# Patient Record
Sex: Female | Born: 1935 | Race: White | Hispanic: No | Smoking: Never smoker
Health system: Southern US, Community
[De-identification: ages and names within clinical notes are randomized; demographics above are authoritative.]

## PROBLEM LIST (undated history)

## (undated) HISTORY — PX: PARTIAL HIP ARTHROPLASTY: SHX733

---

## 2011-09-05 DIAGNOSIS — E785 Hyperlipidemia, unspecified: Secondary | ICD-10-CM | POA: Diagnosis present

## 2012-09-26 DIAGNOSIS — J309 Allergic rhinitis, unspecified: Secondary | ICD-10-CM | POA: Insufficient documentation

## 2012-10-24 DIAGNOSIS — M810 Age-related osteoporosis without current pathological fracture: Secondary | ICD-10-CM | POA: Insufficient documentation

## 2012-10-24 DIAGNOSIS — A6000 Herpesviral infection of urogenital system, unspecified: Secondary | ICD-10-CM | POA: Insufficient documentation

## 2012-10-24 DIAGNOSIS — D134 Benign neoplasm of liver: Secondary | ICD-10-CM | POA: Insufficient documentation

## 2013-03-31 DIAGNOSIS — Z85828 Personal history of other malignant neoplasm of skin: Secondary | ICD-10-CM | POA: Insufficient documentation

## 2014-11-17 DIAGNOSIS — I10 Essential (primary) hypertension: Secondary | ICD-10-CM | POA: Diagnosis present

## 2015-01-07 DIAGNOSIS — Z8639 Personal history of other endocrine, nutritional and metabolic disease: Secondary | ICD-10-CM | POA: Insufficient documentation

## 2015-02-04 DIAGNOSIS — E042 Nontoxic multinodular goiter: Secondary | ICD-10-CM | POA: Insufficient documentation

## 2015-02-04 DIAGNOSIS — E890 Postprocedural hypothyroidism: Secondary | ICD-10-CM | POA: Diagnosis present

## 2017-11-20 DIAGNOSIS — M810 Age-related osteoporosis without current pathological fracture: Secondary | ICD-10-CM | POA: Diagnosis not present

## 2017-11-20 DIAGNOSIS — E782 Mixed hyperlipidemia: Secondary | ICD-10-CM | POA: Diagnosis not present

## 2017-11-20 DIAGNOSIS — Z0000 Encounter for general adult medical examination without abnormal findings: Secondary | ICD-10-CM | POA: Diagnosis not present

## 2017-11-20 DIAGNOSIS — E890 Postprocedural hypothyroidism: Secondary | ICD-10-CM | POA: Diagnosis not present

## 2017-11-20 DIAGNOSIS — I10 Essential (primary) hypertension: Secondary | ICD-10-CM | POA: Diagnosis not present

## 2017-12-13 DIAGNOSIS — H61001 Unspecified perichondritis of right external ear: Secondary | ICD-10-CM | POA: Diagnosis not present

## 2018-04-25 DIAGNOSIS — L821 Other seborrheic keratosis: Secondary | ICD-10-CM | POA: Diagnosis not present

## 2018-04-25 DIAGNOSIS — D2270 Melanocytic nevi of unspecified lower limb, including hip: Secondary | ICD-10-CM | POA: Diagnosis not present

## 2018-04-25 DIAGNOSIS — L820 Inflamed seborrheic keratosis: Secondary | ICD-10-CM | POA: Diagnosis not present

## 2018-04-25 DIAGNOSIS — L814 Other melanin hyperpigmentation: Secondary | ICD-10-CM | POA: Diagnosis not present

## 2018-05-10 DIAGNOSIS — H8110 Benign paroxysmal vertigo, unspecified ear: Secondary | ICD-10-CM | POA: Diagnosis not present

## 2018-05-20 DIAGNOSIS — E890 Postprocedural hypothyroidism: Secondary | ICD-10-CM | POA: Diagnosis not present

## 2018-05-21 DIAGNOSIS — R42 Dizziness and giddiness: Secondary | ICD-10-CM | POA: Diagnosis not present

## 2018-05-21 DIAGNOSIS — I959 Hypotension, unspecified: Secondary | ICD-10-CM | POA: Diagnosis not present

## 2018-05-26 DIAGNOSIS — E782 Mixed hyperlipidemia: Secondary | ICD-10-CM | POA: Diagnosis not present

## 2018-05-26 DIAGNOSIS — M810 Age-related osteoporosis without current pathological fracture: Secondary | ICD-10-CM | POA: Diagnosis not present

## 2018-05-26 DIAGNOSIS — E890 Postprocedural hypothyroidism: Secondary | ICD-10-CM | POA: Diagnosis not present

## 2018-05-26 DIAGNOSIS — I10 Essential (primary) hypertension: Secondary | ICD-10-CM | POA: Diagnosis not present

## 2018-05-28 DIAGNOSIS — Z7409 Other reduced mobility: Secondary | ICD-10-CM | POA: Diagnosis not present

## 2018-05-28 DIAGNOSIS — R51 Headache: Secondary | ICD-10-CM | POA: Diagnosis not present

## 2018-05-28 DIAGNOSIS — R42 Dizziness and giddiness: Secondary | ICD-10-CM | POA: Diagnosis not present

## 2018-06-05 DIAGNOSIS — I639 Cerebral infarction, unspecified: Secondary | ICD-10-CM | POA: Insufficient documentation

## 2018-06-05 DIAGNOSIS — R930 Abnormal findings on diagnostic imaging of skull and head, not elsewhere classified: Secondary | ICD-10-CM | POA: Diagnosis not present

## 2018-06-05 DIAGNOSIS — R42 Dizziness and giddiness: Secondary | ICD-10-CM | POA: Insufficient documentation

## 2018-06-05 DIAGNOSIS — I6339 Cerebral infarction due to thrombosis of other cerebral artery: Secondary | ICD-10-CM | POA: Diagnosis not present

## 2018-06-05 DIAGNOSIS — R51 Headache: Secondary | ICD-10-CM | POA: Diagnosis not present

## 2018-06-05 DIAGNOSIS — R2681 Unsteadiness on feet: Secondary | ICD-10-CM | POA: Diagnosis not present

## 2018-06-09 DIAGNOSIS — R5382 Chronic fatigue, unspecified: Secondary | ICD-10-CM | POA: Diagnosis not present

## 2018-06-16 DIAGNOSIS — R928 Other abnormal and inconclusive findings on diagnostic imaging of breast: Secondary | ICD-10-CM | POA: Diagnosis not present

## 2018-06-16 DIAGNOSIS — R930 Abnormal findings on diagnostic imaging of skull and head, not elsewhere classified: Secondary | ICD-10-CM | POA: Diagnosis not present

## 2018-06-24 DIAGNOSIS — R2681 Unsteadiness on feet: Secondary | ICD-10-CM | POA: Diagnosis not present

## 2018-06-24 DIAGNOSIS — R42 Dizziness and giddiness: Secondary | ICD-10-CM | POA: Diagnosis not present

## 2018-06-24 DIAGNOSIS — G039 Meningitis, unspecified: Secondary | ICD-10-CM | POA: Diagnosis not present

## 2018-06-24 DIAGNOSIS — I951 Orthostatic hypotension: Secondary | ICD-10-CM | POA: Diagnosis not present

## 2018-06-24 DIAGNOSIS — R51 Headache: Secondary | ICD-10-CM | POA: Diagnosis not present

## 2018-06-24 DIAGNOSIS — R0609 Other forms of dyspnea: Secondary | ICD-10-CM | POA: Diagnosis not present

## 2018-06-24 DIAGNOSIS — D352 Benign neoplasm of pituitary gland: Secondary | ICD-10-CM | POA: Diagnosis not present

## 2018-06-27 DIAGNOSIS — E237 Disorder of pituitary gland, unspecified: Secondary | ICD-10-CM | POA: Diagnosis not present

## 2018-06-27 DIAGNOSIS — E890 Postprocedural hypothyroidism: Secondary | ICD-10-CM | POA: Diagnosis not present

## 2018-06-27 DIAGNOSIS — R42 Dizziness and giddiness: Secondary | ICD-10-CM | POA: Diagnosis not present

## 2018-06-27 DIAGNOSIS — E063 Autoimmune thyroiditis: Secondary | ICD-10-CM | POA: Diagnosis not present

## 2018-06-30 DIAGNOSIS — I10 Essential (primary) hypertension: Secondary | ICD-10-CM | POA: Diagnosis not present

## 2018-07-08 DIAGNOSIS — G9389 Other specified disorders of brain: Secondary | ICD-10-CM | POA: Diagnosis not present

## 2018-07-08 DIAGNOSIS — G038 Meningitis due to other specified causes: Secondary | ICD-10-CM | POA: Diagnosis not present

## 2018-07-08 DIAGNOSIS — G039 Meningitis, unspecified: Secondary | ICD-10-CM | POA: Diagnosis not present

## 2018-07-09 DIAGNOSIS — G9389 Other specified disorders of brain: Secondary | ICD-10-CM | POA: Diagnosis not present

## 2018-07-09 DIAGNOSIS — G039 Meningitis, unspecified: Secondary | ICD-10-CM | POA: Diagnosis not present

## 2018-07-09 DIAGNOSIS — G038 Meningitis due to other specified causes: Secondary | ICD-10-CM | POA: Diagnosis not present

## 2018-07-11 DIAGNOSIS — I10 Essential (primary) hypertension: Secondary | ICD-10-CM | POA: Diagnosis not present

## 2018-07-11 DIAGNOSIS — R0602 Shortness of breath: Secondary | ICD-10-CM | POA: Diagnosis not present

## 2018-07-15 DIAGNOSIS — H2513 Age-related nuclear cataract, bilateral: Secondary | ICD-10-CM | POA: Diagnosis not present

## 2018-07-15 DIAGNOSIS — E237 Disorder of pituitary gland, unspecified: Secondary | ICD-10-CM | POA: Diagnosis not present

## 2018-09-05 DIAGNOSIS — Z23 Encounter for immunization: Secondary | ICD-10-CM | POA: Diagnosis not present

## 2018-09-30 DIAGNOSIS — R899 Unspecified abnormal finding in specimens from other organs, systems and tissues: Secondary | ICD-10-CM | POA: Diagnosis not present

## 2018-09-30 DIAGNOSIS — R2681 Unsteadiness on feet: Secondary | ICD-10-CM | POA: Diagnosis not present

## 2018-09-30 DIAGNOSIS — R5383 Other fatigue: Secondary | ICD-10-CM | POA: Diagnosis not present

## 2018-09-30 DIAGNOSIS — G9389 Other specified disorders of brain: Secondary | ICD-10-CM | POA: Diagnosis not present

## 2018-10-02 DIAGNOSIS — R768 Other specified abnormal immunological findings in serum: Secondary | ICD-10-CM | POA: Diagnosis not present

## 2018-10-02 DIAGNOSIS — M150 Primary generalized (osteo)arthritis: Secondary | ICD-10-CM | POA: Diagnosis not present

## 2018-10-02 DIAGNOSIS — G9389 Other specified disorders of brain: Secondary | ICD-10-CM | POA: Diagnosis not present

## 2018-10-03 DIAGNOSIS — R0602 Shortness of breath: Secondary | ICD-10-CM | POA: Diagnosis not present

## 2018-10-03 DIAGNOSIS — I10 Essential (primary) hypertension: Secondary | ICD-10-CM | POA: Diagnosis not present

## 2018-10-08 DIAGNOSIS — G96810 Intracranial hypotension, unspecified: Secondary | ICD-10-CM | POA: Insufficient documentation

## 2018-11-17 DIAGNOSIS — G960 Cerebrospinal fluid leak: Secondary | ICD-10-CM | POA: Diagnosis not present

## 2018-11-27 DIAGNOSIS — Z01818 Encounter for other preprocedural examination: Secondary | ICD-10-CM | POA: Diagnosis not present

## 2018-12-02 DIAGNOSIS — G9389 Other specified disorders of brain: Secondary | ICD-10-CM | POA: Diagnosis not present

## 2018-12-02 DIAGNOSIS — M2518 Fistula, other specified site: Secondary | ICD-10-CM | POA: Diagnosis not present

## 2019-01-12 DIAGNOSIS — G9389 Other specified disorders of brain: Secondary | ICD-10-CM | POA: Diagnosis not present

## 2019-01-12 DIAGNOSIS — M549 Dorsalgia, unspecified: Secondary | ICD-10-CM | POA: Diagnosis not present

## 2019-01-16 DIAGNOSIS — L821 Other seborrheic keratosis: Secondary | ICD-10-CM | POA: Diagnosis not present

## 2019-01-16 DIAGNOSIS — I781 Nevus, non-neoplastic: Secondary | ICD-10-CM | POA: Diagnosis not present

## 2019-01-16 DIAGNOSIS — Z85828 Personal history of other malignant neoplasm of skin: Secondary | ICD-10-CM | POA: Diagnosis not present

## 2019-01-16 DIAGNOSIS — L814 Other melanin hyperpigmentation: Secondary | ICD-10-CM | POA: Diagnosis not present

## 2019-01-20 DIAGNOSIS — M6281 Muscle weakness (generalized): Secondary | ICD-10-CM | POA: Diagnosis not present

## 2019-01-20 DIAGNOSIS — R262 Difficulty in walking, not elsewhere classified: Secondary | ICD-10-CM | POA: Diagnosis not present

## 2019-01-20 DIAGNOSIS — M546 Pain in thoracic spine: Secondary | ICD-10-CM | POA: Diagnosis not present

## 2019-03-24 DIAGNOSIS — E236 Other disorders of pituitary gland: Secondary | ICD-10-CM | POA: Diagnosis not present

## 2019-03-25 DIAGNOSIS — J383 Other diseases of vocal cords: Secondary | ICD-10-CM | POA: Diagnosis not present

## 2019-03-25 DIAGNOSIS — J385 Laryngeal spasm: Secondary | ICD-10-CM | POA: Diagnosis not present

## 2019-03-25 DIAGNOSIS — R490 Dysphonia: Secondary | ICD-10-CM | POA: Diagnosis not present

## 2019-03-26 DIAGNOSIS — R490 Dysphonia: Secondary | ICD-10-CM | POA: Insufficient documentation

## 2019-04-07 DIAGNOSIS — E890 Postprocedural hypothyroidism: Secondary | ICD-10-CM | POA: Diagnosis not present

## 2019-04-16 DIAGNOSIS — E890 Postprocedural hypothyroidism: Secondary | ICD-10-CM | POA: Diagnosis not present

## 2019-04-16 DIAGNOSIS — E063 Autoimmune thyroiditis: Secondary | ICD-10-CM | POA: Diagnosis not present

## 2019-04-16 DIAGNOSIS — I10 Essential (primary) hypertension: Secondary | ICD-10-CM | POA: Diagnosis not present

## 2019-04-16 DIAGNOSIS — E237 Disorder of pituitary gland, unspecified: Secondary | ICD-10-CM | POA: Diagnosis not present

## 2019-04-22 DIAGNOSIS — J385 Laryngeal spasm: Secondary | ICD-10-CM | POA: Diagnosis not present

## 2019-04-22 DIAGNOSIS — R490 Dysphonia: Secondary | ICD-10-CM | POA: Diagnosis not present

## 2019-04-22 DIAGNOSIS — J383 Other diseases of vocal cords: Secondary | ICD-10-CM | POA: Diagnosis not present

## 2019-04-30 DIAGNOSIS — R490 Dysphonia: Secondary | ICD-10-CM | POA: Diagnosis not present

## 2019-04-30 DIAGNOSIS — J385 Laryngeal spasm: Secondary | ICD-10-CM | POA: Diagnosis not present

## 2019-04-30 DIAGNOSIS — J383 Other diseases of vocal cords: Secondary | ICD-10-CM | POA: Diagnosis not present

## 2019-06-03 DIAGNOSIS — E236 Other disorders of pituitary gland: Secondary | ICD-10-CM | POA: Diagnosis not present

## 2019-06-19 DIAGNOSIS — L814 Other melanin hyperpigmentation: Secondary | ICD-10-CM | POA: Diagnosis not present

## 2019-06-19 DIAGNOSIS — B353 Tinea pedis: Secondary | ICD-10-CM | POA: Diagnosis not present

## 2019-06-19 DIAGNOSIS — L821 Other seborrheic keratosis: Secondary | ICD-10-CM | POA: Diagnosis not present

## 2019-06-19 DIAGNOSIS — L820 Inflamed seborrheic keratosis: Secondary | ICD-10-CM | POA: Diagnosis not present

## 2019-06-30 DIAGNOSIS — Z79899 Other long term (current) drug therapy: Secondary | ICD-10-CM | POA: Diagnosis not present

## 2019-06-30 DIAGNOSIS — R490 Dysphonia: Secondary | ICD-10-CM | POA: Diagnosis not present

## 2019-06-30 DIAGNOSIS — J383 Other diseases of vocal cords: Secondary | ICD-10-CM | POA: Diagnosis not present

## 2019-07-27 DIAGNOSIS — N9089 Other specified noninflammatory disorders of vulva and perineum: Secondary | ICD-10-CM | POA: Diagnosis not present

## 2019-07-27 DIAGNOSIS — Z1231 Encounter for screening mammogram for malignant neoplasm of breast: Secondary | ICD-10-CM | POA: Diagnosis not present

## 2019-08-06 DIAGNOSIS — Z1231 Encounter for screening mammogram for malignant neoplasm of breast: Secondary | ICD-10-CM | POA: Diagnosis not present

## 2019-08-19 DIAGNOSIS — I10 Essential (primary) hypertension: Secondary | ICD-10-CM | POA: Diagnosis not present

## 2019-08-23 DIAGNOSIS — I10 Essential (primary) hypertension: Secondary | ICD-10-CM | POA: Diagnosis not present

## 2019-08-23 DIAGNOSIS — R519 Headache, unspecified: Secondary | ICD-10-CM | POA: Diagnosis not present

## 2019-08-23 DIAGNOSIS — R6883 Chills (without fever): Secondary | ICD-10-CM | POA: Diagnosis not present

## 2019-08-23 DIAGNOSIS — R410 Disorientation, unspecified: Secondary | ICD-10-CM | POA: Diagnosis not present

## 2019-08-23 DIAGNOSIS — R42 Dizziness and giddiness: Secondary | ICD-10-CM | POA: Diagnosis not present

## 2019-08-23 DIAGNOSIS — Z79899 Other long term (current) drug therapy: Secondary | ICD-10-CM | POA: Diagnosis not present

## 2019-08-23 DIAGNOSIS — R531 Weakness: Secondary | ICD-10-CM | POA: Diagnosis not present

## 2019-08-23 DIAGNOSIS — R4182 Altered mental status, unspecified: Secondary | ICD-10-CM | POA: Diagnosis not present

## 2019-08-28 DIAGNOSIS — R0602 Shortness of breath: Secondary | ICD-10-CM | POA: Diagnosis not present

## 2019-08-28 DIAGNOSIS — I10 Essential (primary) hypertension: Secondary | ICD-10-CM | POA: Diagnosis not present

## 2019-10-06 DIAGNOSIS — I10 Essential (primary) hypertension: Secondary | ICD-10-CM | POA: Diagnosis not present

## 2019-10-30 DIAGNOSIS — R0602 Shortness of breath: Secondary | ICD-10-CM | POA: Diagnosis not present

## 2019-10-30 DIAGNOSIS — I10 Essential (primary) hypertension: Secondary | ICD-10-CM | POA: Diagnosis not present

## 2019-11-17 DIAGNOSIS — H02125 Mechanical ectropion of left lower eyelid: Secondary | ICD-10-CM | POA: Diagnosis not present

## 2019-12-08 DIAGNOSIS — H04123 Dry eye syndrome of bilateral lacrimal glands: Secondary | ICD-10-CM | POA: Diagnosis not present

## 2020-01-06 DIAGNOSIS — M810 Age-related osteoporosis without current pathological fracture: Secondary | ICD-10-CM | POA: Diagnosis not present

## 2020-01-06 DIAGNOSIS — K219 Gastro-esophageal reflux disease without esophagitis: Secondary | ICD-10-CM | POA: Diagnosis not present

## 2020-01-06 DIAGNOSIS — E039 Hypothyroidism, unspecified: Secondary | ICD-10-CM | POA: Diagnosis not present

## 2020-01-06 DIAGNOSIS — I10 Essential (primary) hypertension: Secondary | ICD-10-CM | POA: Diagnosis not present

## 2020-01-19 DIAGNOSIS — C44529 Squamous cell carcinoma of skin of other part of trunk: Secondary | ICD-10-CM | POA: Diagnosis not present

## 2020-01-19 DIAGNOSIS — L812 Freckles: Secondary | ICD-10-CM | POA: Diagnosis not present

## 2020-01-19 DIAGNOSIS — D485 Neoplasm of uncertain behavior of skin: Secondary | ICD-10-CM | POA: Diagnosis not present

## 2020-01-19 DIAGNOSIS — L821 Other seborrheic keratosis: Secondary | ICD-10-CM | POA: Diagnosis not present

## 2020-01-19 DIAGNOSIS — H61001 Unspecified perichondritis of right external ear: Secondary | ICD-10-CM | POA: Diagnosis not present

## 2020-02-02 DIAGNOSIS — E890 Postprocedural hypothyroidism: Secondary | ICD-10-CM | POA: Diagnosis not present

## 2020-02-02 DIAGNOSIS — R634 Abnormal weight loss: Secondary | ICD-10-CM | POA: Diagnosis not present

## 2020-02-02 DIAGNOSIS — E237 Disorder of pituitary gland, unspecified: Secondary | ICD-10-CM | POA: Diagnosis not present

## 2020-02-02 DIAGNOSIS — E063 Autoimmune thyroiditis: Secondary | ICD-10-CM | POA: Diagnosis not present

## 2020-02-03 DIAGNOSIS — L820 Inflamed seborrheic keratosis: Secondary | ICD-10-CM | POA: Diagnosis not present

## 2020-02-03 DIAGNOSIS — L821 Other seborrheic keratosis: Secondary | ICD-10-CM | POA: Diagnosis not present

## 2020-02-03 DIAGNOSIS — D485 Neoplasm of uncertain behavior of skin: Secondary | ICD-10-CM | POA: Diagnosis not present

## 2020-03-08 DIAGNOSIS — C44529 Squamous cell carcinoma of skin of other part of trunk: Secondary | ICD-10-CM | POA: Diagnosis not present

## 2020-03-08 DIAGNOSIS — C44229 Squamous cell carcinoma of skin of left ear and external auricular canal: Secondary | ICD-10-CM | POA: Diagnosis not present

## 2020-03-08 DIAGNOSIS — L820 Inflamed seborrheic keratosis: Secondary | ICD-10-CM | POA: Diagnosis not present

## 2020-03-08 DIAGNOSIS — C44622 Squamous cell carcinoma of skin of right upper limb, including shoulder: Secondary | ICD-10-CM | POA: Diagnosis not present

## 2020-03-08 DIAGNOSIS — L245 Irritant contact dermatitis due to other chemical products: Secondary | ICD-10-CM | POA: Diagnosis not present

## 2020-03-08 DIAGNOSIS — D485 Neoplasm of uncertain behavior of skin: Secondary | ICD-10-CM | POA: Diagnosis not present

## 2020-03-21 DIAGNOSIS — M25552 Pain in left hip: Secondary | ICD-10-CM | POA: Diagnosis not present

## 2020-03-21 DIAGNOSIS — M7072 Other bursitis of hip, left hip: Secondary | ICD-10-CM | POA: Diagnosis not present

## 2020-03-24 DIAGNOSIS — M25552 Pain in left hip: Secondary | ICD-10-CM | POA: Diagnosis not present

## 2020-03-25 DIAGNOSIS — M25552 Pain in left hip: Secondary | ICD-10-CM | POA: Insufficient documentation

## 2020-04-04 DIAGNOSIS — M25552 Pain in left hip: Secondary | ICD-10-CM | POA: Diagnosis not present

## 2020-04-12 DIAGNOSIS — M25552 Pain in left hip: Secondary | ICD-10-CM | POA: Diagnosis not present

## 2020-05-11 DIAGNOSIS — Z0000 Encounter for general adult medical examination without abnormal findings: Secondary | ICD-10-CM | POA: Diagnosis not present

## 2020-05-11 DIAGNOSIS — M810 Age-related osteoporosis without current pathological fracture: Secondary | ICD-10-CM | POA: Diagnosis not present

## 2020-05-11 DIAGNOSIS — E038 Other specified hypothyroidism: Secondary | ICD-10-CM | POA: Diagnosis not present

## 2020-05-11 DIAGNOSIS — I10 Essential (primary) hypertension: Secondary | ICD-10-CM | POA: Diagnosis not present

## 2020-05-17 DIAGNOSIS — E038 Other specified hypothyroidism: Secondary | ICD-10-CM | POA: Diagnosis not present

## 2020-05-17 DIAGNOSIS — R82998 Other abnormal findings in urine: Secondary | ICD-10-CM | POA: Diagnosis not present

## 2020-05-17 DIAGNOSIS — Z0000 Encounter for general adult medical examination without abnormal findings: Secondary | ICD-10-CM | POA: Diagnosis not present

## 2020-05-17 DIAGNOSIS — M810 Age-related osteoporosis without current pathological fracture: Secondary | ICD-10-CM | POA: Diagnosis not present

## 2020-05-17 DIAGNOSIS — Z1212 Encounter for screening for malignant neoplasm of rectum: Secondary | ICD-10-CM | POA: Diagnosis not present

## 2020-05-17 DIAGNOSIS — I10 Essential (primary) hypertension: Secondary | ICD-10-CM | POA: Diagnosis not present

## 2020-05-19 ENCOUNTER — Other Ambulatory Visit: Payer: Self-pay | Admitting: Internal Medicine

## 2020-05-19 DIAGNOSIS — Z1231 Encounter for screening mammogram for malignant neoplasm of breast: Secondary | ICD-10-CM

## 2020-05-23 DIAGNOSIS — H2513 Age-related nuclear cataract, bilateral: Secondary | ICD-10-CM | POA: Diagnosis not present

## 2020-05-23 DIAGNOSIS — H04123 Dry eye syndrome of bilateral lacrimal glands: Secondary | ICD-10-CM | POA: Diagnosis not present

## 2020-05-23 DIAGNOSIS — H5203 Hypermetropia, bilateral: Secondary | ICD-10-CM | POA: Diagnosis not present

## 2020-05-23 DIAGNOSIS — H35033 Hypertensive retinopathy, bilateral: Secondary | ICD-10-CM | POA: Diagnosis not present

## 2020-06-03 ENCOUNTER — Ambulatory Visit: Payer: Self-pay

## 2020-06-09 DIAGNOSIS — H524 Presbyopia: Secondary | ICD-10-CM | POA: Diagnosis not present

## 2020-06-17 ENCOUNTER — Other Ambulatory Visit: Payer: Self-pay

## 2020-06-17 ENCOUNTER — Ambulatory Visit
Admission: RE | Admit: 2020-06-17 | Discharge: 2020-06-17 | Disposition: A | Discharge status: Home / Self Care | Payer: Medicare Other | Source: Ambulatory Visit | Attending: Internal Medicine | Admitting: Internal Medicine

## 2020-06-17 DIAGNOSIS — Z1231 Encounter for screening mammogram for malignant neoplasm of breast: Secondary | ICD-10-CM | POA: Diagnosis not present

## 2020-06-23 DIAGNOSIS — C44722 Squamous cell carcinoma of skin of right lower limb, including hip: Secondary | ICD-10-CM | POA: Diagnosis not present

## 2020-06-23 DIAGNOSIS — L820 Inflamed seborrheic keratosis: Secondary | ICD-10-CM | POA: Diagnosis not present

## 2020-06-23 DIAGNOSIS — D485 Neoplasm of uncertain behavior of skin: Secondary | ICD-10-CM | POA: Diagnosis not present

## 2020-08-02 DIAGNOSIS — R490 Dysphonia: Secondary | ICD-10-CM | POA: Diagnosis not present

## 2020-08-02 DIAGNOSIS — J383 Other diseases of vocal cords: Secondary | ICD-10-CM | POA: Diagnosis not present

## 2020-08-16 DIAGNOSIS — E871 Hypo-osmolality and hyponatremia: Secondary | ICD-10-CM | POA: Diagnosis not present

## 2020-09-26 DIAGNOSIS — L820 Inflamed seborrheic keratosis: Secondary | ICD-10-CM | POA: Diagnosis not present

## 2020-09-26 DIAGNOSIS — L812 Freckles: Secondary | ICD-10-CM | POA: Diagnosis not present

## 2020-09-26 DIAGNOSIS — B078 Other viral warts: Secondary | ICD-10-CM | POA: Diagnosis not present

## 2020-09-26 DIAGNOSIS — L821 Other seborrheic keratosis: Secondary | ICD-10-CM | POA: Diagnosis not present

## 2020-09-26 DIAGNOSIS — Z85828 Personal history of other malignant neoplasm of skin: Secondary | ICD-10-CM | POA: Diagnosis not present

## 2020-09-26 DIAGNOSIS — D0462 Carcinoma in situ of skin of left upper limb, including shoulder: Secondary | ICD-10-CM | POA: Diagnosis not present

## 2021-01-09 DIAGNOSIS — L821 Other seborrheic keratosis: Secondary | ICD-10-CM | POA: Diagnosis not present

## 2021-01-09 DIAGNOSIS — K219 Gastro-esophageal reflux disease without esophagitis: Secondary | ICD-10-CM | POA: Diagnosis not present

## 2021-01-09 DIAGNOSIS — I10 Essential (primary) hypertension: Secondary | ICD-10-CM | POA: Diagnosis not present

## 2021-01-09 DIAGNOSIS — M810 Age-related osteoporosis without current pathological fracture: Secondary | ICD-10-CM | POA: Diagnosis not present

## 2021-01-09 DIAGNOSIS — E039 Hypothyroidism, unspecified: Secondary | ICD-10-CM | POA: Diagnosis not present

## 2021-01-09 DIAGNOSIS — L820 Inflamed seborrheic keratosis: Secondary | ICD-10-CM | POA: Diagnosis not present

## 2021-01-09 DIAGNOSIS — D485 Neoplasm of uncertain behavior of skin: Secondary | ICD-10-CM | POA: Diagnosis not present

## 2021-01-09 DIAGNOSIS — L812 Freckles: Secondary | ICD-10-CM | POA: Diagnosis not present

## 2021-01-10 DIAGNOSIS — H04123 Dry eye syndrome of bilateral lacrimal glands: Secondary | ICD-10-CM | POA: Diagnosis not present

## 2021-01-10 DIAGNOSIS — H02889 Meibomian gland dysfunction of unspecified eye, unspecified eyelid: Secondary | ICD-10-CM | POA: Diagnosis not present

## 2021-02-09 DIAGNOSIS — N1831 Chronic kidney disease, stage 3a: Secondary | ICD-10-CM | POA: Diagnosis not present

## 2021-02-09 DIAGNOSIS — M810 Age-related osteoporosis without current pathological fracture: Secondary | ICD-10-CM | POA: Diagnosis not present

## 2021-02-09 DIAGNOSIS — I129 Hypertensive chronic kidney disease with stage 1 through stage 4 chronic kidney disease, or unspecified chronic kidney disease: Secondary | ICD-10-CM | POA: Diagnosis not present

## 2021-02-21 DIAGNOSIS — H02889 Meibomian gland dysfunction of unspecified eye, unspecified eyelid: Secondary | ICD-10-CM | POA: Diagnosis not present

## 2021-03-14 DIAGNOSIS — E890 Postprocedural hypothyroidism: Secondary | ICD-10-CM | POA: Diagnosis not present

## 2021-03-28 DIAGNOSIS — H02889 Meibomian gland dysfunction of unspecified eye, unspecified eyelid: Secondary | ICD-10-CM | POA: Diagnosis not present

## 2021-04-20 DIAGNOSIS — E890 Postprocedural hypothyroidism: Secondary | ICD-10-CM | POA: Diagnosis not present

## 2021-04-20 DIAGNOSIS — E237 Disorder of pituitary gland, unspecified: Secondary | ICD-10-CM | POA: Diagnosis not present

## 2021-04-20 DIAGNOSIS — R635 Abnormal weight gain: Secondary | ICD-10-CM | POA: Diagnosis not present

## 2021-04-20 DIAGNOSIS — E063 Autoimmune thyroiditis: Secondary | ICD-10-CM | POA: Diagnosis not present

## 2021-04-25 IMAGING — MG DIGITAL SCREENING BILAT W/ TOMO W/ CAD
6 of 10 series · 6 of 30 positions shown · non-contrast
Comparison: Previous exam(s).

CLINICAL DATA: Screening.

EXAM:
DIGITAL SCREENING BILATERAL MAMMOGRAM WITH TOMO AND CAD

[L MLO synth-2D (1 of 2)]
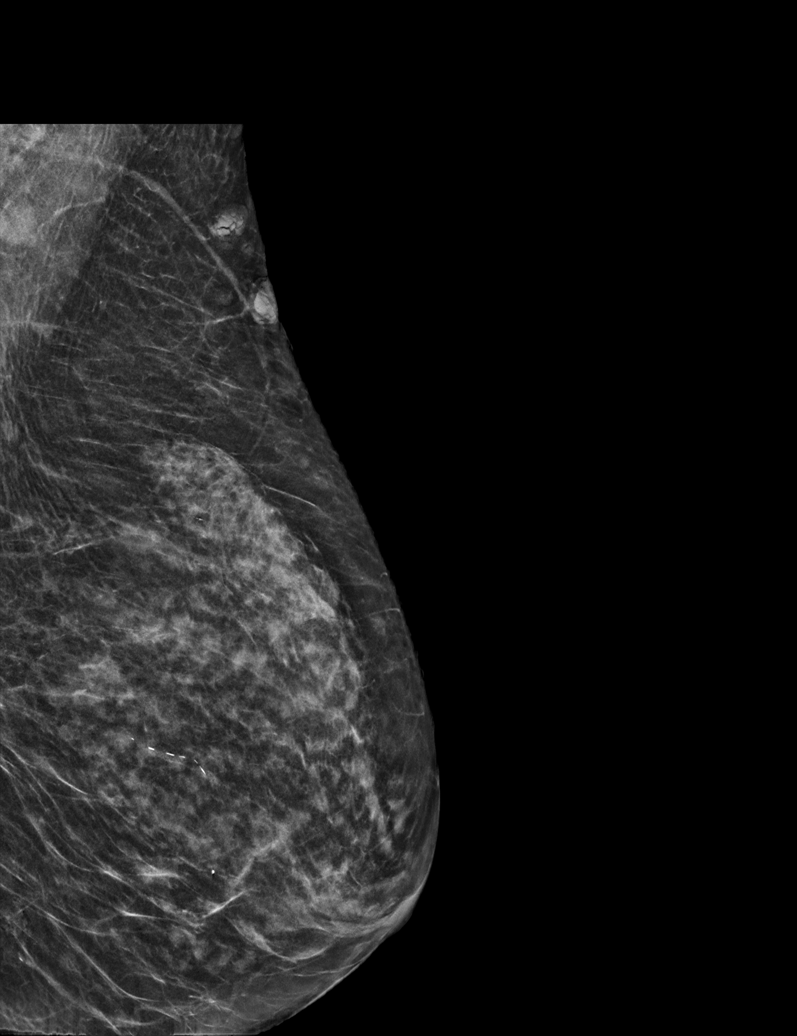

[L CC synth-2D]
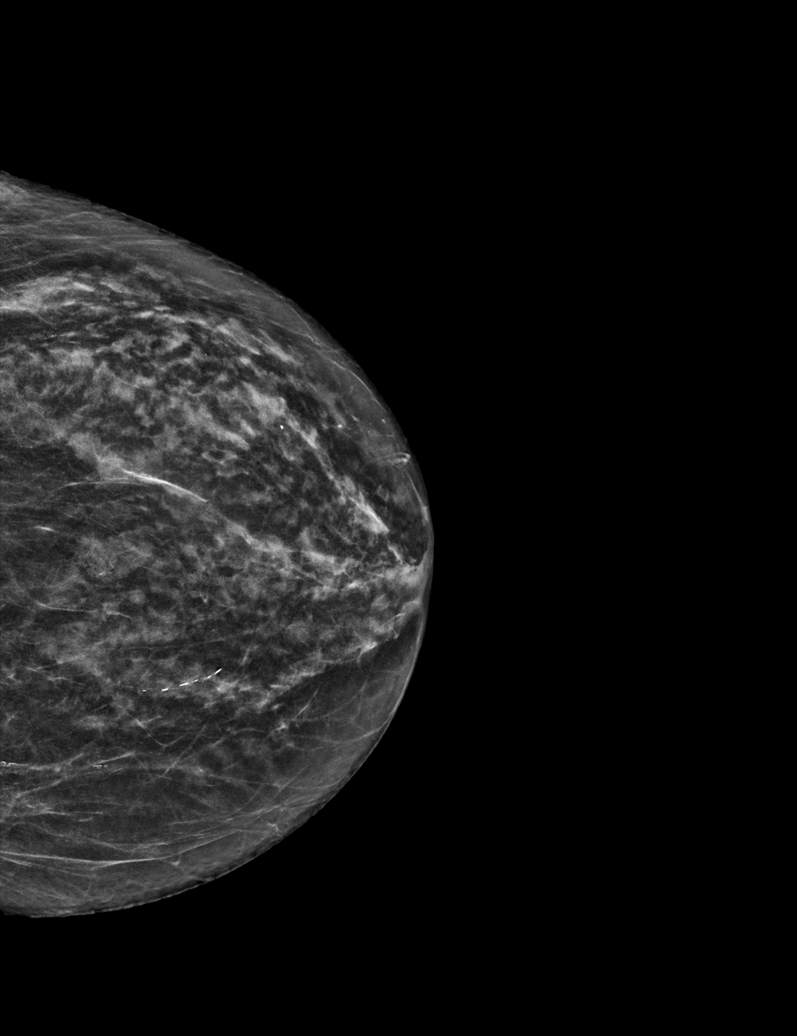

[L MLO synth-2D (2 of 2)]
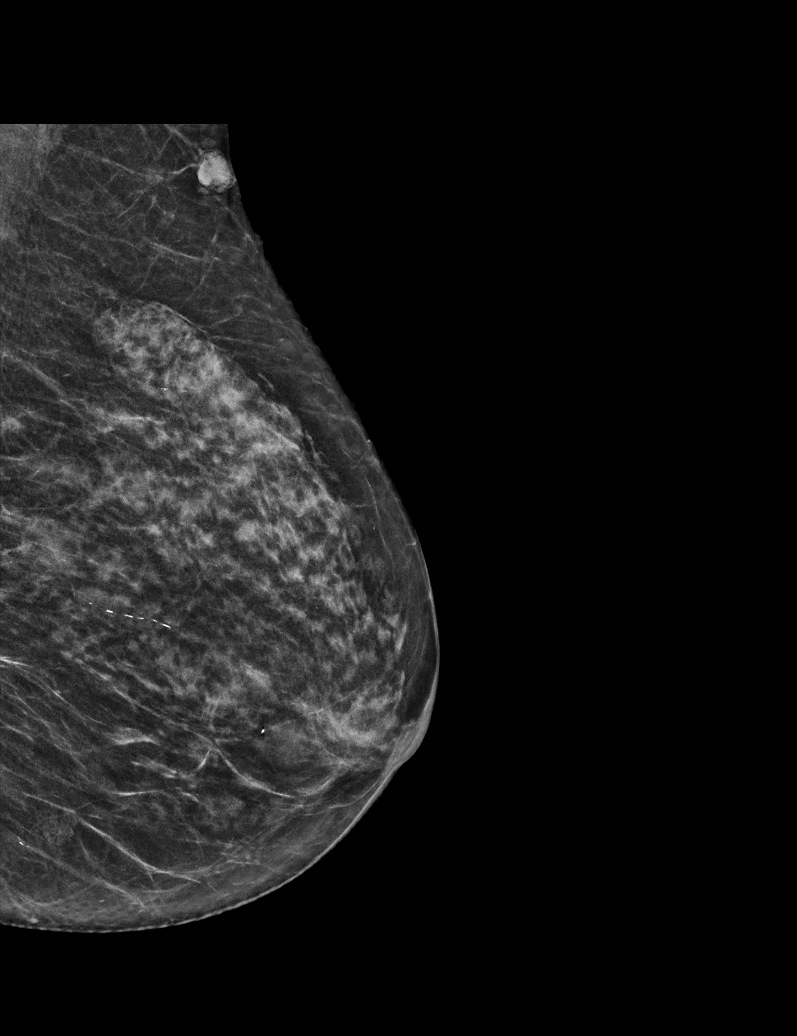

[R MLO synth-2D]
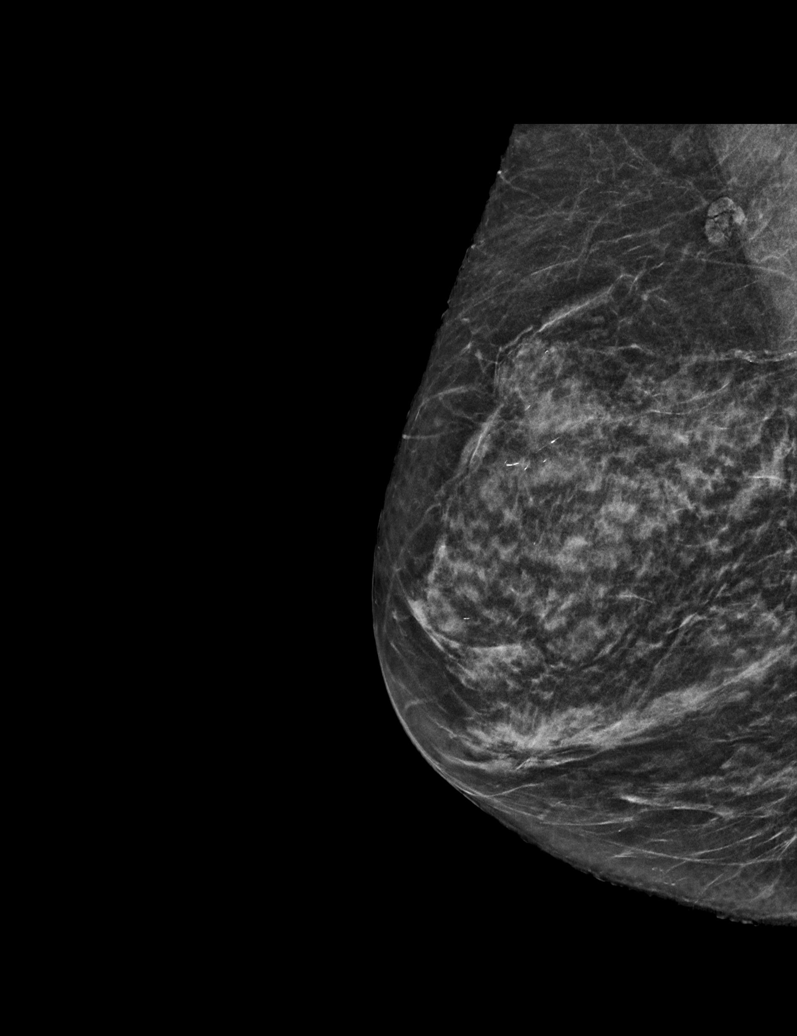

[R CC synth-2D]
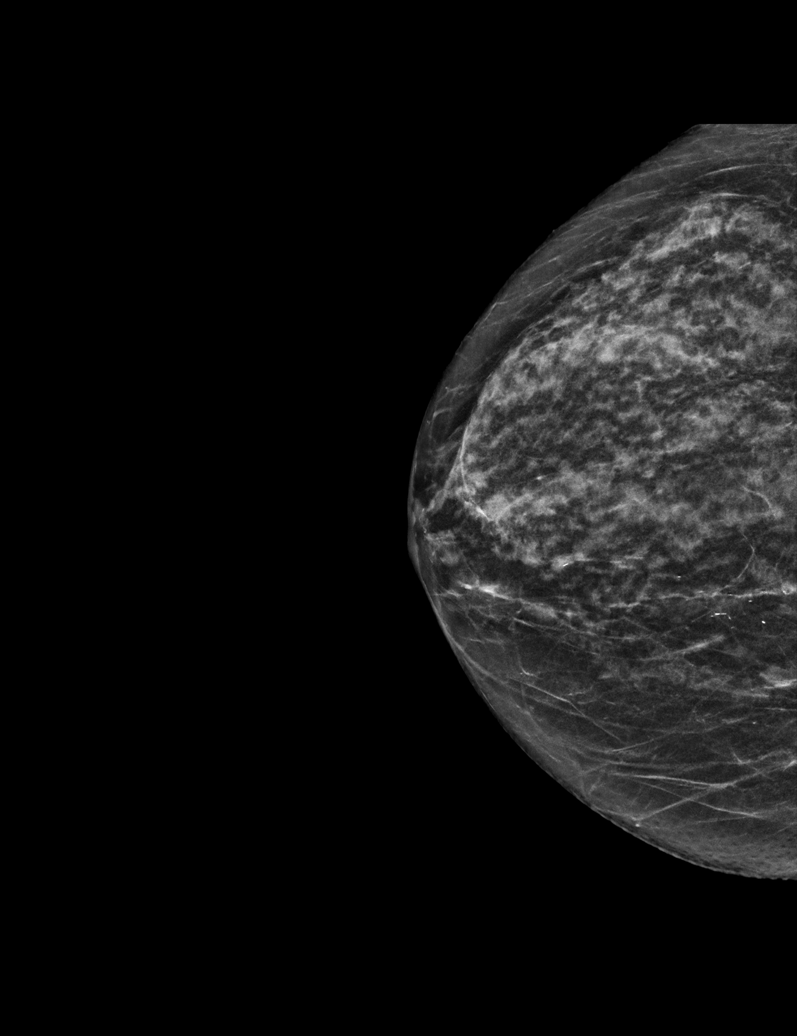

[L MLO tomo · tomo slice 23/46.0]
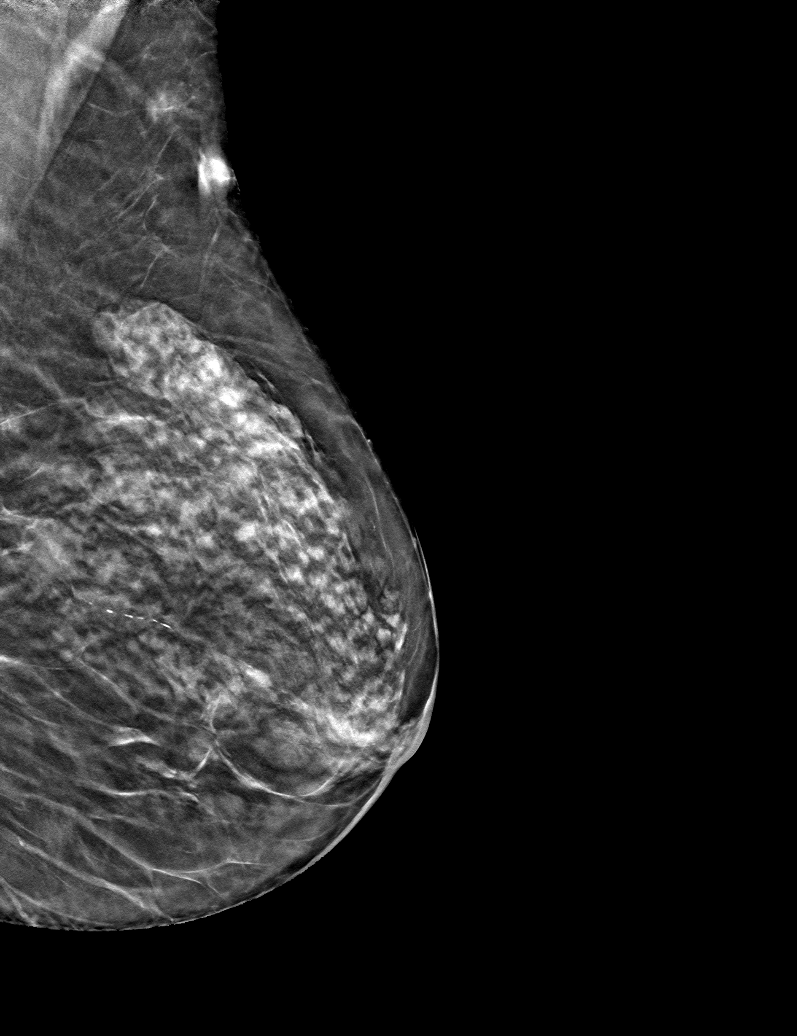

[6 of 30 positions shown; findings below may reference images not displayed]

ACR Breast Density Category c: The breast tissue is heterogeneously
dense, which may obscure small masses.
FINDINGS: There are no findings suspicious for malignancy. Images were
processed with CAD.
IMPRESSION: No mammographic evidence of malignancy. A result letter of this
screening mammogram will be mailed directly to the patient.

RECOMMENDATION:
Screening mammogram in one year. (Code:[id number])

BI-RADS CATEGORY  1: Negative.

## 2021-05-04 ENCOUNTER — Other Ambulatory Visit: Payer: Self-pay | Admitting: Family Medicine

## 2021-05-04 ENCOUNTER — Other Ambulatory Visit: Payer: Self-pay | Admitting: Physician Assistant

## 2021-05-04 DIAGNOSIS — G4452 New daily persistent headache (NDPH): Secondary | ICD-10-CM | POA: Diagnosis not present

## 2021-05-04 DIAGNOSIS — G9600 Cerebrospinal fluid leak, unspecified: Secondary | ICD-10-CM | POA: Diagnosis not present

## 2021-05-04 DIAGNOSIS — R519 Headache, unspecified: Secondary | ICD-10-CM

## 2021-05-04 DIAGNOSIS — E237 Disorder of pituitary gland, unspecified: Secondary | ICD-10-CM

## 2021-05-11 DIAGNOSIS — M810 Age-related osteoporosis without current pathological fracture: Secondary | ICD-10-CM | POA: Diagnosis not present

## 2021-05-11 DIAGNOSIS — I129 Hypertensive chronic kidney disease with stage 1 through stage 4 chronic kidney disease, or unspecified chronic kidney disease: Secondary | ICD-10-CM | POA: Diagnosis not present

## 2021-05-11 DIAGNOSIS — N1831 Chronic kidney disease, stage 3a: Secondary | ICD-10-CM | POA: Diagnosis not present

## 2021-05-18 DIAGNOSIS — E039 Hypothyroidism, unspecified: Secondary | ICD-10-CM | POA: Diagnosis not present

## 2021-05-18 DIAGNOSIS — N1831 Chronic kidney disease, stage 3a: Secondary | ICD-10-CM | POA: Diagnosis not present

## 2021-05-18 DIAGNOSIS — I10 Essential (primary) hypertension: Secondary | ICD-10-CM | POA: Diagnosis not present

## 2021-05-18 DIAGNOSIS — M810 Age-related osteoporosis without current pathological fracture: Secondary | ICD-10-CM | POA: Diagnosis not present

## 2021-05-19 ENCOUNTER — Ambulatory Visit (HOSPITAL_COMMUNITY)
Admission: RE | Admit: 2021-05-19 | Discharge: 2021-05-19 | Disposition: A | Discharge status: Home / Self Care | Payer: Medicare Other | Source: Ambulatory Visit | Attending: Physician Assistant | Admitting: Physician Assistant

## 2021-05-19 ENCOUNTER — Other Ambulatory Visit: Payer: Self-pay

## 2021-05-19 DIAGNOSIS — G9389 Other specified disorders of brain: Secondary | ICD-10-CM | POA: Diagnosis not present

## 2021-05-19 DIAGNOSIS — E237 Disorder of pituitary gland, unspecified: Secondary | ICD-10-CM | POA: Diagnosis not present

## 2021-05-19 DIAGNOSIS — R519 Headache, unspecified: Secondary | ICD-10-CM

## 2021-05-19 MED ORDER — GADOBUTROL 1 MMOL/ML IV SOLN
6.0000 mL | Freq: Once | INTRAVENOUS | Status: AC | PRN
  Administered 2021-05-19: 6 mL via INTRAVENOUS

## 2021-05-25 DIAGNOSIS — R82998 Other abnormal findings in urine: Secondary | ICD-10-CM | POA: Diagnosis not present

## 2021-05-25 DIAGNOSIS — I10 Essential (primary) hypertension: Secondary | ICD-10-CM | POA: Diagnosis not present

## 2021-05-29 DIAGNOSIS — D485 Neoplasm of uncertain behavior of skin: Secondary | ICD-10-CM | POA: Diagnosis not present

## 2021-05-29 DIAGNOSIS — C44622 Squamous cell carcinoma of skin of right upper limb, including shoulder: Secondary | ICD-10-CM | POA: Diagnosis not present

## 2021-05-29 DIAGNOSIS — C4442 Squamous cell carcinoma of skin of scalp and neck: Secondary | ICD-10-CM | POA: Diagnosis not present

## 2021-05-29 DIAGNOSIS — C44329 Squamous cell carcinoma of skin of other parts of face: Secondary | ICD-10-CM | POA: Diagnosis not present

## 2021-06-01 DIAGNOSIS — E890 Postprocedural hypothyroidism: Secondary | ICD-10-CM | POA: Diagnosis not present

## 2021-06-06 DIAGNOSIS — R51 Headache with orthostatic component, not elsewhere classified: Secondary | ICD-10-CM | POA: Diagnosis not present

## 2021-06-08 DIAGNOSIS — D352 Benign neoplasm of pituitary gland: Secondary | ICD-10-CM | POA: Diagnosis not present

## 2021-06-08 DIAGNOSIS — I10 Essential (primary) hypertension: Secondary | ICD-10-CM | POA: Diagnosis not present

## 2021-06-08 DIAGNOSIS — E890 Postprocedural hypothyroidism: Secondary | ICD-10-CM | POA: Diagnosis not present

## 2021-06-11 DIAGNOSIS — M810 Age-related osteoporosis without current pathological fracture: Secondary | ICD-10-CM | POA: Diagnosis not present

## 2021-06-11 DIAGNOSIS — I129 Hypertensive chronic kidney disease with stage 1 through stage 4 chronic kidney disease, or unspecified chronic kidney disease: Secondary | ICD-10-CM | POA: Diagnosis not present

## 2021-06-11 DIAGNOSIS — N1831 Chronic kidney disease, stage 3a: Secondary | ICD-10-CM | POA: Diagnosis not present

## 2021-06-26 DIAGNOSIS — L821 Other seborrheic keratosis: Secondary | ICD-10-CM | POA: Diagnosis not present

## 2021-06-26 DIAGNOSIS — D485 Neoplasm of uncertain behavior of skin: Secondary | ICD-10-CM | POA: Diagnosis not present

## 2021-06-26 DIAGNOSIS — L814 Other melanin hyperpigmentation: Secondary | ICD-10-CM | POA: Diagnosis not present

## 2021-06-26 DIAGNOSIS — C44629 Squamous cell carcinoma of skin of left upper limb, including shoulder: Secondary | ICD-10-CM | POA: Diagnosis not present

## 2021-06-26 DIAGNOSIS — Z85828 Personal history of other malignant neoplasm of skin: Secondary | ICD-10-CM | POA: Diagnosis not present

## 2021-06-29 DIAGNOSIS — M810 Age-related osteoporosis without current pathological fracture: Secondary | ICD-10-CM | POA: Diagnosis not present

## 2021-06-29 DIAGNOSIS — N1831 Chronic kidney disease, stage 3a: Secondary | ICD-10-CM | POA: Diagnosis not present

## 2021-06-29 DIAGNOSIS — E039 Hypothyroidism, unspecified: Secondary | ICD-10-CM | POA: Diagnosis not present

## 2021-06-29 DIAGNOSIS — I10 Essential (primary) hypertension: Secondary | ICD-10-CM | POA: Diagnosis not present

## 2021-08-28 DIAGNOSIS — R51 Headache with orthostatic component, not elsewhere classified: Secondary | ICD-10-CM | POA: Diagnosis not present

## 2021-08-28 DIAGNOSIS — G96811 Intracranial hypotension, spontaneous: Secondary | ICD-10-CM | POA: Diagnosis not present

## 2021-08-28 DIAGNOSIS — M549 Dorsalgia, unspecified: Secondary | ICD-10-CM | POA: Diagnosis not present

## 2021-10-02 DIAGNOSIS — I2510 Atherosclerotic heart disease of native coronary artery without angina pectoris: Secondary | ICD-10-CM | POA: Diagnosis not present

## 2021-10-02 DIAGNOSIS — R918 Other nonspecific abnormal finding of lung field: Secondary | ICD-10-CM | POA: Diagnosis not present

## 2021-10-04 DIAGNOSIS — R519 Headache, unspecified: Secondary | ICD-10-CM | POA: Diagnosis not present

## 2021-10-25 DIAGNOSIS — L570 Actinic keratosis: Secondary | ICD-10-CM | POA: Diagnosis not present

## 2021-10-25 DIAGNOSIS — C4442 Squamous cell carcinoma of skin of scalp and neck: Secondary | ICD-10-CM | POA: Diagnosis not present

## 2021-10-25 DIAGNOSIS — L821 Other seborrheic keratosis: Secondary | ICD-10-CM | POA: Diagnosis not present

## 2021-10-25 DIAGNOSIS — D485 Neoplasm of uncertain behavior of skin: Secondary | ICD-10-CM | POA: Diagnosis not present

## 2021-10-25 DIAGNOSIS — Z85828 Personal history of other malignant neoplasm of skin: Secondary | ICD-10-CM | POA: Diagnosis not present

## 2022-01-12 ENCOUNTER — Other Ambulatory Visit: Payer: Self-pay | Admitting: Internal Medicine

## 2022-01-12 DIAGNOSIS — Z1231 Encounter for screening mammogram for malignant neoplasm of breast: Secondary | ICD-10-CM

## 2022-01-23 ENCOUNTER — Ambulatory Visit
Admission: RE | Admit: 2022-01-23 | Discharge: 2022-01-23 | Disposition: A | Discharge status: Home / Self Care | Payer: Medicare Other | Source: Ambulatory Visit | Attending: Internal Medicine | Admitting: Internal Medicine

## 2022-01-23 ENCOUNTER — Other Ambulatory Visit: Payer: Self-pay

## 2022-01-23 DIAGNOSIS — Z1231 Encounter for screening mammogram for malignant neoplasm of breast: Secondary | ICD-10-CM

## 2022-02-13 DIAGNOSIS — L820 Inflamed seborrheic keratosis: Secondary | ICD-10-CM | POA: Diagnosis not present

## 2022-02-13 DIAGNOSIS — L821 Other seborrheic keratosis: Secondary | ICD-10-CM | POA: Diagnosis not present

## 2022-02-13 DIAGNOSIS — L812 Freckles: Secondary | ICD-10-CM | POA: Diagnosis not present

## 2022-02-13 DIAGNOSIS — Z85828 Personal history of other malignant neoplasm of skin: Secondary | ICD-10-CM | POA: Diagnosis not present

## 2022-03-13 DIAGNOSIS — R51 Headache with orthostatic component, not elsewhere classified: Secondary | ICD-10-CM | POA: Diagnosis not present

## 2022-03-26 DIAGNOSIS — R918 Other nonspecific abnormal finding of lung field: Secondary | ICD-10-CM | POA: Diagnosis not present

## 2022-04-20 DIAGNOSIS — R51 Headache with orthostatic component, not elsewhere classified: Secondary | ICD-10-CM | POA: Diagnosis not present

## 2022-04-20 DIAGNOSIS — G8929 Other chronic pain: Secondary | ICD-10-CM | POA: Diagnosis not present

## 2022-04-20 DIAGNOSIS — I6782 Cerebral ischemia: Secondary | ICD-10-CM | POA: Diagnosis not present

## 2022-04-20 DIAGNOSIS — R519 Headache, unspecified: Secondary | ICD-10-CM | POA: Diagnosis not present

## 2022-04-23 DIAGNOSIS — C3432 Malignant neoplasm of lower lobe, left bronchus or lung: Secondary | ICD-10-CM | POA: Diagnosis not present

## 2022-04-23 DIAGNOSIS — R918 Other nonspecific abnormal finding of lung field: Secondary | ICD-10-CM | POA: Diagnosis not present

## 2022-04-23 DIAGNOSIS — C342 Malignant neoplasm of middle lobe, bronchus or lung: Secondary | ICD-10-CM | POA: Diagnosis not present

## 2022-05-07 DIAGNOSIS — C3490 Malignant neoplasm of unspecified part of unspecified bronchus or lung: Secondary | ICD-10-CM | POA: Diagnosis not present

## 2022-05-07 DIAGNOSIS — R918 Other nonspecific abnormal finding of lung field: Secondary | ICD-10-CM | POA: Diagnosis not present

## 2022-05-30 DIAGNOSIS — C342 Malignant neoplasm of middle lobe, bronchus or lung: Secondary | ICD-10-CM | POA: Diagnosis not present

## 2022-05-30 DIAGNOSIS — C3432 Malignant neoplasm of lower lobe, left bronchus or lung: Secondary | ICD-10-CM | POA: Diagnosis not present

## 2022-06-12 DIAGNOSIS — C342 Malignant neoplasm of middle lobe, bronchus or lung: Secondary | ICD-10-CM | POA: Diagnosis not present

## 2022-06-12 DIAGNOSIS — C3432 Malignant neoplasm of lower lobe, left bronchus or lung: Secondary | ICD-10-CM | POA: Diagnosis not present

## 2022-06-18 DIAGNOSIS — C3432 Malignant neoplasm of lower lobe, left bronchus or lung: Secondary | ICD-10-CM | POA: Diagnosis not present

## 2022-06-18 DIAGNOSIS — C342 Malignant neoplasm of middle lobe, bronchus or lung: Secondary | ICD-10-CM | POA: Diagnosis not present

## 2022-06-19 DIAGNOSIS — C342 Malignant neoplasm of middle lobe, bronchus or lung: Secondary | ICD-10-CM | POA: Diagnosis not present

## 2022-06-19 DIAGNOSIS — C3432 Malignant neoplasm of lower lobe, left bronchus or lung: Secondary | ICD-10-CM | POA: Diagnosis not present

## 2022-06-20 DIAGNOSIS — C3432 Malignant neoplasm of lower lobe, left bronchus or lung: Secondary | ICD-10-CM | POA: Diagnosis not present

## 2022-06-20 DIAGNOSIS — C342 Malignant neoplasm of middle lobe, bronchus or lung: Secondary | ICD-10-CM | POA: Diagnosis not present

## 2022-06-21 DIAGNOSIS — C342 Malignant neoplasm of middle lobe, bronchus or lung: Secondary | ICD-10-CM | POA: Diagnosis not present

## 2022-06-21 DIAGNOSIS — C3432 Malignant neoplasm of lower lobe, left bronchus or lung: Secondary | ICD-10-CM | POA: Diagnosis not present

## 2022-06-22 DIAGNOSIS — C342 Malignant neoplasm of middle lobe, bronchus or lung: Secondary | ICD-10-CM | POA: Diagnosis not present

## 2022-06-22 DIAGNOSIS — C3432 Malignant neoplasm of lower lobe, left bronchus or lung: Secondary | ICD-10-CM | POA: Diagnosis not present

## 2022-07-19 DIAGNOSIS — E039 Hypothyroidism, unspecified: Secondary | ICD-10-CM | POA: Diagnosis not present

## 2022-07-19 DIAGNOSIS — M810 Age-related osteoporosis without current pathological fracture: Secondary | ICD-10-CM | POA: Diagnosis not present

## 2022-07-19 DIAGNOSIS — I10 Essential (primary) hypertension: Secondary | ICD-10-CM | POA: Diagnosis not present

## 2022-07-19 DIAGNOSIS — R7989 Other specified abnormal findings of blood chemistry: Secondary | ICD-10-CM | POA: Diagnosis not present

## 2022-07-23 DIAGNOSIS — I10 Essential (primary) hypertension: Secondary | ICD-10-CM | POA: Diagnosis not present

## 2022-07-23 DIAGNOSIS — R82998 Other abnormal findings in urine: Secondary | ICD-10-CM | POA: Diagnosis not present

## 2022-07-23 DIAGNOSIS — E039 Hypothyroidism, unspecified: Secondary | ICD-10-CM | POA: Diagnosis not present

## 2022-07-23 DIAGNOSIS — D485 Neoplasm of uncertain behavior of skin: Secondary | ICD-10-CM | POA: Diagnosis not present

## 2022-07-23 DIAGNOSIS — C44729 Squamous cell carcinoma of skin of left lower limb, including hip: Secondary | ICD-10-CM | POA: Diagnosis not present

## 2022-07-23 DIAGNOSIS — Z85828 Personal history of other malignant neoplasm of skin: Secondary | ICD-10-CM | POA: Diagnosis not present

## 2022-07-23 DIAGNOSIS — L821 Other seborrheic keratosis: Secondary | ICD-10-CM | POA: Diagnosis not present

## 2022-07-23 DIAGNOSIS — Z0000 Encounter for general adult medical examination without abnormal findings: Secondary | ICD-10-CM | POA: Diagnosis not present

## 2022-07-23 DIAGNOSIS — D1801 Hemangioma of skin and subcutaneous tissue: Secondary | ICD-10-CM | POA: Diagnosis not present

## 2022-07-23 DIAGNOSIS — M810 Age-related osteoporosis without current pathological fracture: Secondary | ICD-10-CM | POA: Diagnosis not present

## 2022-07-23 DIAGNOSIS — N1831 Chronic kidney disease, stage 3a: Secondary | ICD-10-CM | POA: Diagnosis not present

## 2022-08-21 DIAGNOSIS — G9600 Cerebrospinal fluid leak, unspecified: Secondary | ICD-10-CM | POA: Diagnosis not present

## 2022-08-21 DIAGNOSIS — G96811 Intracranial hypotension, spontaneous: Secondary | ICD-10-CM | POA: Diagnosis not present

## 2022-08-21 DIAGNOSIS — R519 Headache, unspecified: Secondary | ICD-10-CM | POA: Diagnosis not present

## 2022-08-22 DIAGNOSIS — G9600 Cerebrospinal fluid leak, unspecified: Secondary | ICD-10-CM | POA: Diagnosis not present

## 2022-08-22 DIAGNOSIS — R519 Headache, unspecified: Secondary | ICD-10-CM | POA: Diagnosis not present

## 2022-09-25 DIAGNOSIS — G96810 Intracranial hypotension, unspecified: Secondary | ICD-10-CM | POA: Diagnosis not present

## 2022-10-01 DIAGNOSIS — I10 Essential (primary) hypertension: Secondary | ICD-10-CM | POA: Diagnosis not present

## 2022-10-01 DIAGNOSIS — D352 Benign neoplasm of pituitary gland: Secondary | ICD-10-CM | POA: Diagnosis not present

## 2022-10-01 DIAGNOSIS — U071 COVID-19: Secondary | ICD-10-CM | POA: Diagnosis not present

## 2022-10-01 DIAGNOSIS — C3491 Malignant neoplasm of unspecified part of right bronchus or lung: Secondary | ICD-10-CM | POA: Diagnosis not present

## 2022-10-01 DIAGNOSIS — N1831 Chronic kidney disease, stage 3a: Secondary | ICD-10-CM | POA: Diagnosis not present

## 2022-10-02 ENCOUNTER — Observation Stay (HOSPITAL_COMMUNITY)
Admission: EM | Admit: 2022-10-02 | Discharge: 2022-10-05 | Disposition: A | Discharge status: Home Health | Payer: Medicare Other | Attending: Student | Admitting: Student

## 2022-10-02 ENCOUNTER — Other Ambulatory Visit: Payer: Self-pay

## 2022-10-02 ENCOUNTER — Emergency Department (HOSPITAL_COMMUNITY): Payer: Medicare Other

## 2022-10-02 ENCOUNTER — Encounter (HOSPITAL_COMMUNITY): Payer: Self-pay | Admitting: Internal Medicine

## 2022-10-02 DIAGNOSIS — I498 Other specified cardiac arrhythmias: Secondary | ICD-10-CM | POA: Diagnosis present

## 2022-10-02 DIAGNOSIS — M6281 Muscle weakness (generalized): Secondary | ICD-10-CM | POA: Insufficient documentation

## 2022-10-02 DIAGNOSIS — R2681 Unsteadiness on feet: Secondary | ICD-10-CM | POA: Insufficient documentation

## 2022-10-02 DIAGNOSIS — E871 Hypo-osmolality and hyponatremia: Secondary | ICD-10-CM | POA: Diagnosis not present

## 2022-10-02 DIAGNOSIS — E039 Hypothyroidism, unspecified: Secondary | ICD-10-CM | POA: Insufficient documentation

## 2022-10-02 DIAGNOSIS — R918 Other nonspecific abnormal finding of lung field: Secondary | ICD-10-CM | POA: Diagnosis present

## 2022-10-02 DIAGNOSIS — E876 Hypokalemia: Secondary | ICD-10-CM | POA: Diagnosis not present

## 2022-10-02 DIAGNOSIS — R0600 Dyspnea, unspecified: Secondary | ICD-10-CM | POA: Diagnosis not present

## 2022-10-02 DIAGNOSIS — E785 Hyperlipidemia, unspecified: Secondary | ICD-10-CM | POA: Diagnosis present

## 2022-10-02 DIAGNOSIS — G9600 Cerebrospinal fluid leak, unspecified: Secondary | ICD-10-CM | POA: Diagnosis present

## 2022-10-02 DIAGNOSIS — U071 COVID-19: Secondary | ICD-10-CM | POA: Diagnosis not present

## 2022-10-02 DIAGNOSIS — R059 Cough, unspecified: Secondary | ICD-10-CM | POA: Diagnosis not present

## 2022-10-02 DIAGNOSIS — R911 Solitary pulmonary nodule: Secondary | ICD-10-CM | POA: Diagnosis not present

## 2022-10-02 DIAGNOSIS — R509 Fever, unspecified: Secondary | ICD-10-CM | POA: Diagnosis not present

## 2022-10-02 DIAGNOSIS — I10 Essential (primary) hypertension: Secondary | ICD-10-CM | POA: Diagnosis present

## 2022-10-02 DIAGNOSIS — R7989 Other specified abnormal findings of blood chemistry: Secondary | ICD-10-CM | POA: Diagnosis present

## 2022-10-02 DIAGNOSIS — E890 Postprocedural hypothyroidism: Secondary | ICD-10-CM | POA: Diagnosis present

## 2022-10-02 LAB — CBC WITH DIFFERENTIAL/PLATELET
Abs Immature Granulocytes: 0.03 10*3/uL (ref 0.00–0.07)
Basophils Absolute: 0 10*3/uL (ref 0.0–0.1)
Basophils Relative: 0 %
Eosinophils Absolute: 0 10*3/uL (ref 0.0–0.5)
Eosinophils Relative: 0 %
HCT: 36.3 % (ref 36.0–46.0)
Hemoglobin: 13.1 g/dL (ref 12.0–15.0)
Immature Granulocytes: 1 %
Lymphocytes Relative: 6 %
Lymphs Abs: 0.4 10*3/uL — ABNORMAL LOW (ref 0.7–4.0)
MCH: 30.7 pg (ref 26.0–34.0)
MCHC: 36.1 g/dL — ABNORMAL HIGH (ref 30.0–36.0)
MCV: 85 fL (ref 80.0–100.0)
Monocytes Absolute: 0.9 10*3/uL (ref 0.1–1.0)
Monocytes Relative: 14 %
Neutro Abs: 5.2 10*3/uL (ref 1.7–7.7)
Neutrophils Relative %: 79 %
Platelets: 203 10*3/uL (ref 150–400)
RBC: 4.27 MIL/uL (ref 3.87–5.11)
RDW: 11.9 % (ref 11.5–15.5)
WBC: 6.5 10*3/uL (ref 4.0–10.5)
nRBC: 0 % (ref 0.0–0.2)

## 2022-10-02 LAB — TROPONIN I (HIGH SENSITIVITY)
Troponin I (High Sensitivity): 5 ng/L (ref <18)
Troponin I (High Sensitivity): 7 ng/L (ref <18)

## 2022-10-02 LAB — C-REACTIVE PROTEIN: CRP: 13 mg/dL — ABNORMAL HIGH (ref <1.0)

## 2022-10-02 LAB — COMPREHENSIVE METABOLIC PANEL
ALT: 14 U/L (ref 0–44)
AST: 44 U/L — ABNORMAL HIGH (ref 15–41)
Albumin: 3.5 g/dL (ref 3.5–5.0)
Alkaline Phosphatase: 54 U/L (ref 38–126)
Anion gap: 14 (ref 5–15)
BUN: 19 mg/dL (ref 8–23)
CO2: 28 mmol/L (ref 22–32)
Calcium: 8.9 mg/dL (ref 8.9–10.3)
Chloride: 85 mmol/L — ABNORMAL LOW (ref 98–111)
Creatinine, Ser: 0.81 mg/dL (ref 0.44–1.00)
GFR, Estimated: >60 mL/min (ref >60)
Glucose, Bld: 111 mg/dL — ABNORMAL HIGH (ref 70–99)
Potassium: 2.8 mmol/L — ABNORMAL LOW (ref 3.5–5.1)
Sodium: 127 mmol/L — ABNORMAL LOW (ref 135–145)
Total Bilirubin: 0.8 mg/dL (ref 0.3–1.2)
Total Protein: 7.1 g/dL (ref 6.5–8.1)

## 2022-10-02 LAB — FERRITIN: Ferritin: 277 ng/mL (ref 11–307)

## 2022-10-02 LAB — MAGNESIUM: Magnesium: 1.8 mg/dL (ref 1.7–2.4)

## 2022-10-02 LAB — BASIC METABOLIC PANEL
Anion gap: 14 (ref 5–15)
BUN: 19 mg/dL (ref 8–23)
CO2: 27 mmol/L (ref 22–32)
Calcium: 8.9 mg/dL (ref 8.9–10.3)
Chloride: 84 mmol/L — ABNORMAL LOW (ref 98–111)
Creatinine, Ser: 0.83 mg/dL (ref 0.44–1.00)
GFR, Estimated: >60 mL/min (ref >60)
Glucose, Bld: 118 mg/dL — ABNORMAL HIGH (ref 70–99)
Potassium: 2.8 mmol/L — ABNORMAL LOW (ref 3.5–5.1)
Sodium: 125 mmol/L — ABNORMAL LOW (ref 135–145)

## 2022-10-02 LAB — RESP PANEL BY RT-PCR (FLU A&B, COVID) ARPGX2
Influenza A by PCR: NEGATIVE
Influenza B by PCR: NEGATIVE
SARS Coronavirus 2 by RT PCR: POSITIVE — AB

## 2022-10-02 LAB — D-DIMER, QUANTITATIVE: D-Dimer, Quant: 0.65 ug/mL-FEU — ABNORMAL HIGH (ref 0.00–0.50)

## 2022-10-02 MED ORDER — HYDROCOD POLI-CHLORPHE POLI ER 10-8 MG/5ML PO SUER
5.0000 mL | Freq: Two times a day (BID) | ORAL | Status: DC | PRN
  Administered 2022-10-02 – 2022-10-04 (×2): 5 mL via ORAL
  Filled 2022-10-02 (×2): qty 5

## 2022-10-02 MED ORDER — ONDANSETRON HCL 4 MG PO TABS: 4.0000 mg | ORAL_TABLET | Freq: Four times a day (QID) | ORAL | Status: DC | PRN

## 2022-10-02 MED ORDER — NIRMATRELVIR/RITONAVIR (PAXLOVID)TABLET
3.0000 | ORAL_TABLET | Freq: Two times a day (BID) | ORAL | Status: DC
  Administered 2022-10-02 – 2022-10-05 (×6): 3 via ORAL
  Filled 2022-10-02: qty 30

## 2022-10-02 MED ORDER — ALBUTEROL SULFATE HFA 108 (90 BASE) MCG/ACT IN AERS: 2.0000 | INHALATION_SPRAY | RESPIRATORY_TRACT | Status: DC | PRN

## 2022-10-02 MED ORDER — POTASSIUM CHLORIDE 10 MEQ/100ML IV SOLN
10.0000 meq | INTRAVENOUS | Status: AC
  Administered 2022-10-02 (×2): 10 meq via INTRAVENOUS
  Filled 2022-10-02 (×2): qty 100

## 2022-10-02 MED ORDER — ONDANSETRON HCL 4 MG/2ML IJ SOLN: 4.0000 mg | Freq: Four times a day (QID) | INTRAMUSCULAR | Status: DC | PRN

## 2022-10-02 MED ORDER — LEVOTHYROXINE SODIUM 88 MCG PO TABS
88.0000 ug | ORAL_TABLET | Freq: Every day | ORAL | Status: DC
  Administered 2022-10-03 – 2022-10-05 (×3): 88 ug via ORAL
  Filled 2022-10-02 (×3): qty 1

## 2022-10-02 MED ORDER — GUAIFENESIN-DM 100-10 MG/5ML PO SYRP
10.0000 mL | ORAL_SOLUTION | ORAL | Status: DC | PRN
  Administered 2022-10-03: 10 mL via ORAL
  Filled 2022-10-02: qty 10

## 2022-10-02 MED ORDER — ENOXAPARIN SODIUM 40 MG/0.4ML IJ SOSY
40.0000 mg | PREFILLED_SYRINGE | INTRAMUSCULAR | Status: DC
  Administered 2022-10-02 – 2022-10-04 (×3): 40 mg via SUBCUTANEOUS
  Filled 2022-10-02 (×3): qty 0.4

## 2022-10-02 MED ORDER — PREDNISONE 20 MG PO TABS
40.0000 mg | ORAL_TABLET | Freq: Every day | ORAL | Status: DC
  Administered 2022-10-03 – 2022-10-05 (×3): 40 mg via ORAL
  Filled 2022-10-02 (×3): qty 2

## 2022-10-02 MED ORDER — SODIUM CHLORIDE 0.9 % IV BOLUS
500.0000 mL | Freq: Once | INTRAVENOUS | Status: AC
  Administered 2022-10-02: 500 mL via INTRAVENOUS

## 2022-10-02 MED ORDER — ACETAMINOPHEN 325 MG PO TABS
650.0000 mg | ORAL_TABLET | Freq: Four times a day (QID) | ORAL | Status: DC | PRN
  Administered 2022-10-02: 650 mg via ORAL
  Filled 2022-10-02: qty 2

## 2022-10-02 MED ORDER — METOPROLOL SUCCINATE ER 25 MG PO TB24
25.0000 mg | ORAL_TABLET | Freq: Every day | ORAL | Status: DC
  Administered 2022-10-02 – 2022-10-05 (×4): 25 mg via ORAL
  Filled 2022-10-02 (×4): qty 1

## 2022-10-02 MED ORDER — ZINC SULFATE 220 (50 ZN) MG PO CAPS
220.0000 mg | ORAL_CAPSULE | Freq: Every day | ORAL | Status: DC
  Administered 2022-10-02 – 2022-10-05 (×4): 220 mg via ORAL
  Filled 2022-10-02 (×5): qty 1

## 2022-10-02 MED ORDER — POTASSIUM CHLORIDE CRYS ER 20 MEQ PO TBCR
40.0000 meq | EXTENDED_RELEASE_TABLET | Freq: Three times a day (TID) | ORAL | Status: AC
  Administered 2022-10-02 (×2): 40 meq via ORAL
  Filled 2022-10-02 (×2): qty 2

## 2022-10-02 MED ORDER — ACETAMINOPHEN 650 MG RE SUPP: 650.0000 mg | Freq: Four times a day (QID) | RECTAL | Status: DC | PRN

## 2022-10-02 MED ORDER — VITAMIN C 500 MG PO TABS
500.0000 mg | ORAL_TABLET | Freq: Every day | ORAL | Status: DC
  Administered 2022-10-02 – 2022-10-05 (×4): 500 mg via ORAL
  Filled 2022-10-02 (×4): qty 1

## 2022-10-02 MED ORDER — SODIUM CHLORIDE 0.9 % IV SOLN
INTRAVENOUS | Status: DC | PRN
  Administered 2022-10-02: 10 mL/h via INTRAVENOUS

## 2022-10-02 NOTE — Progress Notes (Signed)
PT demonstrated hands on understanding of Flutter device. 

## 2022-10-02 NOTE — ED Provider Triage Note (Signed)
Emergency Medicine Provider Triage Evaluation Note  Madison Doyle , a 86 y.o. female  was evaluated in triage.  Pt complains of weakness.  Has been diagnosed with COVID around Saturday and has been progressively worsening.  Appears dyspneic during interview with conversational dyspnea.  Was found to have sodium of 119 this morning.  Denies chest pain..  States that she was treated with antibiotics and antivirals without improvement.  Daughter is at bedside.  Review of Systems  Positive: As above Negative: As above  Physical Exam  BP (!) 151/83 (BP Location: Left Arm)   Pulse 83   Temp 98.3 F (36.8 C) (Oral)   Resp 18   SpO2 99%  Gen:   Awake, mild distress   Resp:  Normal effort MSK:   Moves extremities without difficulty  Other:    Medical Decision Making  Medically screening exam initiated at 10:41 AM.  Appropriate orders placed.  Caytlin Better Ion was informed that the remainder of the evaluation will be completed by another provider, this initial triage assessment does not replace that evaluation, and the importance of remaining in the ED until their evaluation is complete.     Evlyn Courier, PA-C 10/02/22 1042

## 2022-10-02 NOTE — ED Provider Notes (Signed)
[facility] DEPT Provider Note   CSN: [id number] Arrival date & time: 10/02/22  1013     History  Chief Complaint  Patient presents with   abnormal labs    Madison Doyle is a 86 y.o. female.  HPI 86 year old female history of pretension, CSF leak, began having cough and cold symptoms last week on either Wednesday or Thursday.  She had a home test positive for COVID on Saturday.  Her primary care physician started her on Levaquin and molnupiravir Saturday night.  She has continued to feel worse with fevers, cough, generalized Allies.  Her physician saw her in the office on Monday and drew labs.  She was called today and told that her sodium was low and to come to the emergency department.  Her daughter-in-law who has been her caregiver during this time states that she was told it was 86.  She is also told that her sodiums have run low around 134 in the past with the last one being checked a month ago.  However this is new.  There is no report of seizures.    Home Medications Prior to Admission medications   Not on File      Allergies    Patient has no known allergies.    Review of Systems   Review of Systems  Physical Exam Updated Vital Signs BP (!) 146/83   Pulse 73   Temp 98.3 F (36.8 C) (Oral)   Resp 16   Ht 1.676 m ('5\' 6"'$ )   Wt 61.2 kg   SpO2 97%   BMI 21.79 kg/m  Physical Exam Vitals and nursing note reviewed.  Constitutional:      Appearance: Normal appearance.  HENT:     Head: Normocephalic.     Right Ear: External ear normal.     Left Ear: External ear normal.     Nose: Nose normal.     Mouth/Throat:     Pharynx: Oropharynx is clear.  Eyes:     Extraocular Movements: Extraocular movements intact.     Pupils: Pupils are equal, round, and reactive to light.  Cardiovascular:     Rate and Rhythm: Normal rate and regular rhythm.     Pulses: Normal pulses.  Pulmonary:     Effort: Pulmonary effort is normal.  Abdominal:      General: Abdomen is flat.     Palpations: Abdomen is soft.  Musculoskeletal:        General: Normal range of motion.     Cervical back: Normal range of motion.  Skin:    General: Skin is warm and dry.     Capillary Refill: Capillary refill takes less than 2 seconds.  Neurological:     General: No focal deficit present.     Mental Status: She is alert and oriented to person, place, and time.     Motor: No weakness.     Coordination: Coordination normal.  Psychiatric:        Mood and Affect: Mood normal.        Behavior: Behavior normal.     ED Results / Procedures / Treatments   Labs (all labs ordered are listed, but only abnormal results are displayed) Labs Reviewed  RESP PANEL BY RT-PCR (FLU A&B, COVID) ARPGX2 - Abnormal; Notable for the following components:      Result Value   SARS Coronavirus 2 by RT PCR POSITIVE (*)    All other components within normal limits  CBC WITH DIFFERENTIAL/PLATELET -  Abnormal; Notable for the following components:   MCHC 36.1 (*)    Lymphs Abs 0.4 (*)    All other components within normal limits  BASIC METABOLIC PANEL - Abnormal; Notable for the following components:   Sodium 125 (*)    Potassium 2.8 (*)    Chloride 84 (*)    Glucose, Bld 118 (*)    All other components within normal limits  COMPREHENSIVE METABOLIC PANEL - Abnormal; Notable for the following components:   Sodium 127 (*)    Potassium 2.8 (*)    Chloride 85 (*)    Glucose, Bld 111 (*)    AST 44 (*)    All other components within normal limits  CULTURE, BLOOD (ROUTINE X 2)  CULTURE, BLOOD (ROUTINE X 2)  MAGNESIUM  TROPONIN I (HIGH SENSITIVITY)  TROPONIN I (HIGH SENSITIVITY)    EKG None  Radiology DG Chest Port 1 View  Result Date: 10/02/2022 CLINICAL DATA:  Cough and fever.  COVID positive. EXAM: PORTABLE CHEST 1 VIEW COMPARISON:  None Available. FINDINGS: The lungs are clear without focal pneumonia, edema, pneumothorax or pleural effusion. Pulmonary nodule  identified left mid lung. Probable staple line right upper lobe. The cardiopericardial silhouette is within normal limits for size. Bones are diffusely demineralized. IMPRESSION: 1. Isolated pulmonary nodule in the left mid lung. CT chest without contrast recommended to further evaluate. 2. No evidence for focal airspace consolidation or pulmonary edema. 3. Probable staple line right upper lobe suggest prior wedge resection. Electronically Signed   By: Misty Stanley M.D.   On: 10/02/2022 11:47    Procedures .Critical Care  Performed by: Pattricia Boss, MD Authorized by: Pattricia Boss, MD   Critical care provider statement:    Critical care time (minutes):  30   Critical care end time:  10/02/2022 1:27 PM   Critical care was necessary to treat or prevent imminent or life-threatening deterioration of the following conditions:  Dehydration   Critical care was time spent personally by me on the following activities:  Development of treatment plan with patient or surrogate, discussions with consultants, evaluation of patient's response to treatment, examination of patient, ordering and review of laboratory studies, ordering and review of radiographic studies, ordering and performing treatments and interventions, pulse oximetry, re-evaluation of patient's condition and review of old charts     Medications Ordered in ED Medications  potassium chloride 10 mEq in 100 mL IVPB (10 mEq Intravenous New Bag/Given 10/02/22 1313)  0.9 %  sodium chloride infusion (10 mL/hr Intravenous New Bag/Given 10/02/22 1314)  sodium chloride 0.9 % bolus 500 mL (0 mLs Intravenous Stopped 10/02/22 1307)    ED Course/ Medical Decision Making/ A&P Clinical Course as of 10/02/22 1326  Tue Oct 02, 2022  1159 CBC reviewed interpreted and within normal limits [DR]  1302 COVID swab performed and patient is COVID-positive [DR]  6712 Basic metabolic panel reviewed interpreted significant for hyponatremia at 125 and hypokalemia at  2.8 with hypochloremia at 84 [DR]  1303 This x-Semaje Kinker reviewed interpreted no evidence of focal airspace consolidation however a pulmonary nodule in the left midlung field is noted [DR]  1303 CBC reviewed interpreted within normal limits [DR]    Clinical Course User Index [DR] Pattricia Boss, MD                           Medical Decision Making 86 year old female with COVID positive and generalized  malaise as well as appears to be  volume depleted with hyponatremia, hypokalemia and hypochloremia. Patient is being gently fluid resuscitated here in the ED with 500 cc of normal saline. There is no report of seizure activity or altered mental status. Otherwise vital signs are stable Plan admission for ongoing electrolyte replacement and observation 1- weakness-likely secondary to covid infection and hyponatremia 2 covid 3 hyponatremia- acute on chronic 4 hypokalemia  Amount and/or Complexity of Data Reviewed External Data Reviewed: notes.    Details: Notes from primary care office reviewed Labs: ordered. Decision-making details documented in ED Course. Radiology: ordered and independent interpretation performed. Decision-making details documented in ED Course.  Risk Decision regarding hospitalization.  Discussed with Dr. Olevia Bowens and will see for admission         Final Clinical Impression(s) / ED Diagnoses Final diagnoses:  COVID  Hyponatremia  Hypokalemia    Rx / DC Orders ED Discharge Orders     None         Pattricia Boss, MD 10/02/22 1327

## 2022-10-02 NOTE — Discharge Instructions (Signed)

## 2022-10-02 NOTE — H&P (Signed)
History and Physical    Patient: Madison Doyle Madison Doyle DOB: 1936-09-23 DOA: 10/02/2022 DOS: the patient was seen and examined on 10/02/2022 PCP: Ginger Organ., MD  Patient coming from: Home  Chief Complaint:  Chief Complaint  Patient presents with   abnormal labs   HPI: Madison Doyle is a 86 y.o. female with medical history significant of hypertension, hypothyroidism, pulmonary nodules, CSF leak who presented to the emergency department due to having abnormal labs.  Her PCP called her today and told her that her sodium was 119.  The patient tested +3 days ago for COVID.  She was started on molnupiravir and Levaquin 3 days ago.  She was started on prednisone yesterday.  She has has been having rhinorrhea, mild sore throat, cough which is mostly nonproductive, decreased appetite, generalized weakness, mild dyspnea, fevers, night sweats, body aches and malaise.  No significant improvement with current treatment.  No wheezing or hemoptysis.  No chest pain, palpitations, diaphoresis, PND, orthopnea or recent pitting edema of the lower extremities.  No  abdominal pain, diarrhea, constipation, melena or hematochezia.  No flank pain, dysuria, frequency or hematuria.  No polyuria, polydipsia, polyphagia.  ED course: Initial vital signs were temperature 98.3 F, pulse 83, respiration 18, BP 151/83 mmHg O2 sat 99% on room air.  The patient received KCl 10 mEq IVPB x2 and 500 mL of normal saline bolus.  Lab work: Coronavirus PCR was positive.  CBC with a white count 6.5, hemoglobin 13.1 g/dL platelets 203.  D-dimer was 0.65.  Magnesium was 1.8.  Troponin was negative x2.  Sodium 125, potassium 2.8, chloride 84 and CO2 27 mmol/L.  Renal function was normal.  LFTs were normal except for mildly elevated AST of 44.  Imaging: Portable 1 view chest radiograph with an isolated pulmonary nodule in the left midlung.  CT chest without contrast recommended to further evaluate.   Review of Systems: As  mentioned in the history of present illness. All other systems reviewed and are negative.  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  Hip fracture 1996  COLONOSCOPY W/BIOPSY 05/31/2015  Procedure: COLONOSCOPY, FLEXIBLE; WITH BIOPSY, SINGLE OR MULTIPLE; Surgeon: Sharalyn Ink, MD; Location: [facility]; Service: Gastroenterology;;  Kandice Hams N/A 12/02/2018  Procedure: MICROSURGICAL TECHNIQUES, REQUIRING USE OF OPERATING MICROSCOPE (LIST IN ADDITION TO PRIMARY PROCEDURE); Surgeon: Almon Register, MD; Location: [facility] OPERATING ROOMS; Service: Neurosurgery; Laterality: N/A;  INJECTION EPIDURAL BLOOD PATCH  FOR CSF LEAK (PT REPORTED)  MOHS SURGERY  BCC removed from R inferior lateral cheek  THYROIDECTOMY TOTAL   Social History:  has no history on file for tobacco use, alcohol use, and drug use.  No Known Allergies  No family history on file.  Current medications: Metoprolol succinate 25 mg p.o. daily. Hydrochlorothiazide 12.5 mg p.o. daily. Levothyroxine 88 mcg p.o. daily.   Physical Exam: Vitals:   10/02/22 1230 10/02/22 1300 10/02/22 1430 10/02/22 1537  BP: (!) 151/86 (!) 146/83 136/79 136/82  Pulse: 82 73 78 74  Resp: '16 16 16 20  '$ Temp:   98.3 F (36.8 C) 98.2 F (36.8 C)  TempSrc:   Oral Oral  SpO2: 98% 97% 100% 99%  Weight:      Height:       Physical Exam Vitals and nursing note reviewed.  Constitutional:      General: She is awake.     Appearance: She is normal weight.  HENT:     Head: Normocephalic.     Nose: Rhinorrhea present.     Mouth/Throat:     Mouth: Mucous membranes are moist.  Eyes:     General: No scleral icterus.    Pupils: Pupils are equal, round, and reactive to light.  Neck:     Vascular: No JVD.  Cardiovascular:     Rate and Rhythm: Normal rate and regular rhythm.     Heart sounds: S1 normal and S2 normal.  Pulmonary:     Effort:  Pulmonary effort is normal.     Breath sounds: Normal breath sounds. No wheezing, rhonchi or rales.  Abdominal:     General: Bowel sounds are normal.     Palpations: Abdomen is soft.     Tenderness: There is no abdominal tenderness.  Musculoskeletal:     Cervical back: Neck supple.     Right lower leg: No edema.     Left lower leg: No edema.     Comments: Generalized weakness.  Neurological:     General: No focal deficit present.     Mental Status: She is alert and oriented to person, place, and time.  Psychiatric:        Mood and Affect: Mood normal.        Behavior: Behavior normal. Behavior is cooperative.     Data Reviewed:  Results are pending, will review when available.  Assessment and Plan: Principal Problem:   2019 novel coronavirus disease (COVID-19) Observation/telemetry. Supplemental oxygen as needed. Bronchodilators as needed. Incentive spirometry while awake. Flutter valve exercises. Antitussives as needed. Vitamin C/zinc supplementation Continue prednisone 40 mg p.o. daily. Begin Paxlovid 3 tabs twice daily. Follow-up CBC, CMP and inflammatory markers.  Active Problems:   Hypokalemia Correcting. Magnesium was supplemented. Check potassium level in the morning.    Hyponatremia Hold HCTZ for now. PCP attempted to hold and got very hypertensive. Her cardiology wants to keep her on it. Consider decreasing to 6.25 mg daily.    Ventricular bigeminy Correct electrolytes. Has not take metoprolol today. Resume metoprolol 25 mg daily this evening.    Hypertension Holding HCTZ. Continue metoprolol. Monitor blood pressure closely.    Hyperlipidemia Currently not on medical therapy.    Pulmonary nodules Inpatient/or outpatient CT chest.    Postoperative hypothyroidism Continue levothyroxine 88 mcg p.o. daily.    CSF leak Follow-up with [facility] as scheduled.     Advance Care Planning:   Code Status: Full Code   Consults:   Family  Communication: Her daughter was a bedside.   Severity of Illness: The appropriate patient status for this patient is OBSERVATION. Observation status is judged to be reasonable and necessary in order to provide the required intensity of service to ensure the patient's safety. The patient's presenting symptoms, physical exam findings, and initial radiographic and laboratory data in the context of their medical condition is felt to place them at decreased risk for further clinical deterioration. Furthermore, it is anticipated that the patient will be medically stable for discharge from the hospital within 2 midnights of admission.   Author: Reubin Milan, MD 10/02/2022 4:25 PM  For on call review [email or web address].   This document was prepared using Dragon voice recognition software and may contain some unintended transcription errors.

## 2022-10-02 NOTE — ED Triage Notes (Signed)
Family states pt tested positive for COVID 3 days ago. Pt started antibiotic and antiviral 3 days ago, started prednisone yesterday. PCP called today and said sodium was 119. Pt reports generalized weakness, cough, decreased appetite.

## 2022-10-03 ENCOUNTER — Encounter (HOSPITAL_COMMUNITY): Payer: Self-pay | Admitting: Internal Medicine

## 2022-10-03 DIAGNOSIS — U071 COVID-19: Secondary | ICD-10-CM | POA: Diagnosis not present

## 2022-10-03 LAB — COMPREHENSIVE METABOLIC PANEL
ALT: 14 U/L (ref 0–44)
AST: 36 U/L (ref 15–41)
Albumin: 2.9 g/dL — ABNORMAL LOW (ref 3.5–5.0)
Alkaline Phosphatase: 46 U/L (ref 38–126)
Anion gap: 8 (ref 5–15)
BUN: 14 mg/dL (ref 8–23)
CO2: 30 mmol/L (ref 22–32)
Calcium: 8.7 mg/dL — ABNORMAL LOW (ref 8.9–10.3)
Chloride: 98 mmol/L (ref 98–111)
Creatinine, Ser: 0.62 mg/dL (ref 0.44–1.00)
GFR, Estimated: >60 mL/min (ref >60)
Glucose, Bld: 96 mg/dL (ref 70–99)
Potassium: 3.6 mmol/L (ref 3.5–5.1)
Sodium: 136 mmol/L (ref 135–145)
Total Bilirubin: 0.7 mg/dL (ref 0.3–1.2)
Total Protein: 6 g/dL — ABNORMAL LOW (ref 6.5–8.1)

## 2022-10-03 LAB — CBC WITH DIFFERENTIAL/PLATELET
Abs Immature Granulocytes: 0.03 10*3/uL (ref 0.00–0.07)
Basophils Absolute: 0 10*3/uL (ref 0.0–0.1)
Basophils Relative: 0 %
Eosinophils Absolute: 0 10*3/uL (ref 0.0–0.5)
Eosinophils Relative: 0 %
HCT: 33.5 % — ABNORMAL LOW (ref 36.0–46.0)
Hemoglobin: 11.5 g/dL — ABNORMAL LOW (ref 12.0–15.0)
Immature Granulocytes: 1 %
Lymphocytes Relative: 17 %
Lymphs Abs: 0.9 10*3/uL (ref 0.7–4.0)
MCH: 30.3 pg (ref 26.0–34.0)
MCHC: 34.3 g/dL (ref 30.0–36.0)
MCV: 88.4 fL (ref 80.0–100.0)
Monocytes Absolute: 0.6 10*3/uL (ref 0.1–1.0)
Monocytes Relative: 11 %
Neutro Abs: 3.6 10*3/uL (ref 1.7–7.7)
Neutrophils Relative %: 71 %
Platelets: 207 10*3/uL (ref 150–400)
RBC: 3.79 MIL/uL — ABNORMAL LOW (ref 3.87–5.11)
RDW: 11.9 % (ref 11.5–15.5)
WBC: 5.1 10*3/uL (ref 4.0–10.5)
nRBC: 0 % (ref 0.0–0.2)

## 2022-10-03 LAB — C-REACTIVE PROTEIN: CRP: 7.9 mg/dL — ABNORMAL HIGH (ref <1.0)

## 2022-10-03 LAB — FERRITIN: Ferritin: 212 ng/mL (ref 11–307)

## 2022-10-03 LAB — MAGNESIUM: Magnesium: 2.2 mg/dL (ref 1.7–2.4)

## 2022-10-03 LAB — D-DIMER, QUANTITATIVE: D-Dimer, Quant: 0.51 ug/mL-FEU — ABNORMAL HIGH (ref 0.00–0.50)

## 2022-10-03 LAB — PHOSPHORUS: Phosphorus: 2.9 mg/dL (ref 2.5–4.6)

## 2022-10-03 MED ORDER — IPRATROPIUM-ALBUTEROL 20-100 MCG/ACT IN AERS
1.0000 | INHALATION_SPRAY | Freq: Four times a day (QID) | RESPIRATORY_TRACT | Status: DC
  Administered 2022-10-03: 1 via RESPIRATORY_TRACT
  Filled 2022-10-03: qty 4

## 2022-10-03 MED ORDER — METOPROLOL TARTRATE 5 MG/5ML IV SOLN: 5.0000 mg | INTRAVENOUS | Status: DC | PRN

## 2022-10-03 MED ORDER — SENNOSIDES-DOCUSATE SODIUM 8.6-50 MG PO TABS: 1.0000 | ORAL_TABLET | Freq: Every evening | ORAL | Status: DC | PRN

## 2022-10-03 MED ORDER — TRAZODONE HCL 50 MG PO TABS: 50.0000 mg | ORAL_TABLET | Freq: Every evening | ORAL | Status: DC | PRN

## 2022-10-03 MED ORDER — HYDRALAZINE HCL 20 MG/ML IJ SOLN: 10.0000 mg | INTRAMUSCULAR | Status: DC | PRN

## 2022-10-03 NOTE — Evaluation (Signed)
Physical Therapy Evaluation Patient Details Name: Madison Doyle MRN: [id number] DOB: August 01, 1936 Today's Date: 10/03/2022  History of Present Illness  86 y.o. female with presented to the emergency department due to having abnormal labs her PCP notified her of sodium was 119 and COVID + since 09/30/2022. PMH: medical history significant of hypertension, hypothyroidism, pulmonary nodules, CSF leak  Clinical Impression  Pt   presents with a lot of generalized weakness, and stated she has been recently declining over past few weeks. She lives home alone but has required more help with her daughter in law recently. Spoke to family and patient and they would like to return home but realize they need guidance on how to get pt more help in the home. With her weakness I recommend PT, OT and MS follow her while here, and HHPT once home. Will continue to follow     Recommendations for follow up therapy are one component of a multi-disciplinary discharge planning process, led by the attending physician.  Recommendations may be updated based on patient status, additional functional criteria and insurance authorization.  Follow Up Recommendations Home health PT      Assistance Recommended at Discharge Frequent or constant Supervision/Assistance  Patient can return home with the following  A little help with walking and/or transfers;A little help with bathing/dressing/bathroom;Assistance with cooking/housework;Assist for transportation;Help with stairs or ramp for entrance    Equipment Recommendations None recommended by PT  Recommendations for Other Services       Functional Status Assessment Patient has had a recent decline in their functional status and demonstrates the ability to make significant improvements in function in a reasonable and predictable amount of time.     Precautions / Restrictions Restrictions Weight Bearing Restrictions: No      Mobility  Bed Mobility Overal bed  mobility: Modified Independent Bed Mobility: Supine to Sit, Sit to Supine     Supine to sit: Min guard     General bed mobility comments: HOB elevated and used handrail, first time pt had gotten up in a while.    Transfers Overall transfer level: Needs assistance Equipment used: Rolling walker (2 wheels) Transfers: Sit to/from Stand Sit to Stand: Min assist           General transfer comment: M=Min A to help boost and stedy upon rising. Weak and reported some dizziness    Ambulation/Gait Ambulation/Gait assistance: Min guard Gait Distance (Feet): 30 Feet Assistive device: Rolling walker (2 wheels) Gait Pattern/deviations: Step-through pattern       General Gait Details: gait distance limited, pt caughed and became urgency incontinence of urine, so we had to make to back tot he bathroom for toileting and hygiene.  Stairs            Wheelchair Mobility    Modified Rankin (Stroke Patients Only)       Balance Overall balance assessment: Needs assistance Sitting-balance support: No upper extremity supported, Feet supported Sitting balance-Leahy Scale: Fair Sitting balance - Comments: pt sate for about 5 minutes EOB taking pills with nursing staff. She quickly became fatigued from this small activity and scooted tot eh foot board in order to help support her trunk for sitting this time frame.   Standing balance support: Bilateral upper extremity supported, During functional activity Standing balance-Leahy Scale: Fair                               Pertinent Vitals/Pain Pain Assessment  Pain Assessment: No/denies pain    Home Living Family/patient expects to be discharged to:: Private residence Living Arrangements: Alone Available Help at Discharge: Family (sister in law has been helping more recently , but needing to get more help) Type of Home: House Home Access: Stairs to enter Entrance Stairs-Rails: Right Entrance Stairs-Number of Steps:  2-3   Home Layout: One level Home Equipment: Rollator (4 wheels)      Prior Function Prior Level of Function : Independent/Modified Independent;Needs assist             Mobility Comments: pt states she has been declinign and getting more weak. Recently she has started to use the RW, and requiring help from daughter in law when she used to be home alone and not needing help. ADLs Comments: Pt states she performs all ADLS indepedently until recently not feeeling well. She has someone come in and help with house cleaning.  Family helps with groceries.     Hand Dominance        Extremity/Trunk Assessment   Upper Extremity Assessment Upper Extremity Assessment:  (noted Bil UES shaking / weakness or tremors when trying to use utensils or hold cup during PT session.)    Lower Extremity Assessment Lower Extremity Assessment: Generalized weakness (pt was able to functional walk, but had challenges for power boost to lift off and squating at toilet for hygiene and cleaning. Weakness was evident.)       Communication   Communication: HOH  Cognition Arousal/Alertness: Awake/alert Behavior During Therapy: WFL for tasks assessed/performed Overall Cognitive Status: Within Functional Limits for tasks assessed                                          General Comments      Exercises     Assessment/Plan    PT Assessment Patient needs continued PT services  PT Problem List Decreased strength;Decreased activity tolerance;Decreased balance;Decreased mobility       PT Treatment Interventions DME instruction;Gait training;Functional mobility training;Therapeutic activities;Therapeutic exercise;Balance training;Patient/family education    PT Goals (Current goals can be found in the Care Plan section)  Acute Rehab PT Goals Patient Stated Goal: I want to get better and go home . I may need help at home PT Goal Formulation: With patient Time For Goal Achievement:  10/17/22 Potential to Achieve Goals: Good    Frequency Min 3X/week     Co-evaluation               AM-PAC PT "6 Clicks" Mobility  Outcome Measure Help needed turning from your back to your side while in a flat bed without using bedrails?: A Little Help needed moving from lying on your back to sitting on the side of a flat bed without using bedrails?: A Little Help needed moving to and from a bed to a chair (including a wheelchair)?: A Little Help needed standing up from a chair using your arms (e.g., wheelchair or bedside chair)?: A Little Help needed to walk in hospital room?: A Little Help needed climbing 3-5 steps with a railing? : A Little 6 Click Score: 18    End of Session   Activity Tolerance: Patient tolerated treatment well Patient left: in chair;with call bell/phone within reach;with family/visitor present (educated about call when she needed to get up and nurse stated she did not need a chair alarm.) Nurse Communication: Mobility status PT Visit  Diagnosis: Muscle weakness (generalized) (M62.81)    Time: 2774-1287 PT Time Calculation (min) (ACUTE ONLY): 45 min   Charges:   PT Evaluation $PT Eval Low Complexity: 1 Low PT Treatments $Gait Training: 8-22 mins $Therapeutic Activity: 8-22 mins        Gatha Mayer, PT, MPT Acute Rehabilitation Services Office: [phone] If a weekend: [facility] w/e pager [phone] 10/03/2022   Clide Dales 10/03/2022, 12:30 PM

## 2022-10-03 NOTE — Plan of Care (Signed)

## 2022-10-03 NOTE — Evaluation (Signed)
Physical Therapy Evaluation Patient Details Name: Madison Doyle MRN: [id number] DOB: 06/26/1936 Today's Date: 10/03/2022  History of Present Illness  86 y.o. female with presented to the emergency department due to having abnormal labs her PCP notified her of sodium was 119 and COVID + since 09/30/2022. PMH: medical history significant of hypertension, hypothyroidism, pulmonary nodules, CSF leak  Clinical Impression  Pt was very weak during session with fatigue just sitting EOB to take medicines, then began to walk and became incontinent of urine so we had to walk to bathroom and back. This was very fatiguing for patient. I feel she will gain some strength back but needs to be followed by PT , OT and our mobility team while here and recommend HHPT and family assist upon DC too.        Recommendations for follow up therapy are one component of a multi-disciplinary discharge planning process, led by the attending physician.  Recommendations may be updated based on patient status, additional functional criteria and insurance authorization.  Follow Up Recommendations Home health PT      Assistance Recommended at Discharge Frequent or constant Supervision/Assistance  Patient can return home with the following  A little help with walking and/or transfers;A little help with bathing/dressing/bathroom;Assistance with cooking/housework;Assist for transportation;Help with stairs or ramp for entrance    Equipment Recommendations None recommended by PT  Recommendations for Other Services       Functional Status Assessment Patient has had a recent decline in their functional status and demonstrates the ability to make significant improvements in function in a reasonable and predictable amount of time.     Precautions / Restrictions Restrictions Weight Bearing Restrictions: No      Mobility  Bed Mobility Overal bed mobility: Modified Independent Bed Mobility: Supine to Sit, Sit to Supine      Supine to sit: Min guard     General bed mobility comments: HOB elevated and used handrail, first time pt had gotten up in a while.    Transfers Overall transfer level: Needs assistance Equipment used: Rolling walker (2 wheels) Transfers: Sit to/from Stand Sit to Stand: Min assist           General transfer comment: M=Min A to help boost and stedy upon rising. Weak and reported some dizziness    Ambulation/Gait Ambulation/Gait assistance: Min guard Gait Distance (Feet): 30 Feet Assistive device: Rolling walker (2 wheels) Gait Pattern/deviations: Step-through pattern       General Gait Details: gait distance limited, pt caughed and became urgency incontinence of urine, so we had to make to back tot he bathroom for toileting and hygiene.  Stairs            Wheelchair Mobility    Modified Rankin (Stroke Patients Only)       Balance Overall balance assessment: Needs assistance Sitting-balance support: No upper extremity supported, Feet supported Sitting balance-Leahy Scale: Fair Sitting balance - Comments: pt sate for about 5 minutes EOB taking pills with nursing staff. She quickly became fatigued from this small activity and scooted tot eh foot board in order to help support her trunk for sitting this time frame.   Standing balance support: Bilateral upper extremity supported, During functional activity Standing balance-Leahy Scale: Fair                               Pertinent Vitals/Pain Pain Assessment Pain Assessment: No/denies pain    Home Living Family/patient expects  to be discharged to:: Private residence Living Arrangements: Alone Available Help at Discharge: Family (sister in law has been helping more recently , but needing to get more help) Type of Home: House Home Access: Stairs to enter Entrance Stairs-Rails: Right Entrance Stairs-Number of Steps: 2-3   Home Layout: One level [place]: Rollator (4 wheels)      Prior  Function Prior Level of Function : Independent/Modified Independent;Needs assist             Mobility Comments: pt states she has been declinign and getting more weak. Recently she has started to use the RW, and requiring help from daughter in law when she used to be home alone and not needing help. ADLs Comments: Pt states she performs all ADLS indepedently until recently not feeeling well. She has someone come in and help with house cleaning.  Family helps with groceries.     Hand Dominance        Extremity/Trunk Assessment   Upper Extremity Assessment Upper Extremity Assessment:  (noted Bil UES shaking / weakness or tremors when trying to use utensils or hold cup during PT session.)    Lower Extremity Assessment Lower Extremity Assessment: Generalized weakness (pt was able to functional walk, but had challenges for power boost to lift off and squating at toilet for hygiene and cleaning. Weakness was evident.)       Communication   Communication: HOH  Cognition Arousal/Alertness: Awake/alert Behavior During Therapy: WFL for tasks assessed/performed Overall Cognitive Status: Within Functional Limits for tasks assessed                                          General Comments      Exercises     Assessment/Plan    PT Assessment Patient needs continued PT services  PT Problem List Decreased strength;Decreased activity tolerance;Decreased balance;Decreased mobility       PT Treatment Interventions DME instruction;Gait training;Functional mobility training;Therapeutic activities;Therapeutic exercise;Balance training;Patient/family education    PT Goals (Current goals can be found in the Care Plan section)  Acute Rehab PT Goals Patient Stated Goal: I want to get better and go home . I may need help at home PT Goal Formulation: With patient Time For Goal Achievement: 10/17/22 Potential to Achieve Goals: Good    Frequency Min 3X/week      Co-evaluation               AM-PAC PT "6 Clicks" Mobility  Outcome Measure Help needed turning from your back to your side while in a flat bed without using bedrails?: A Little Help needed moving from lying on your back to sitting on the side of a flat bed without using bedrails?: A Little Help needed moving to and from a bed to a chair (including a wheelchair)?: A Little Help needed standing up from a chair using your arms (e.g., wheelchair or bedside chair)?: A Little Help needed to walk in hospital room?: A Little Help needed climbing 3-5 steps with a railing? : A Little 6 Click Score: 18    End of Session   Activity Tolerance: Patient tolerated treatment well Patient left: in chair;with call bell/phone within reach;with family/visitor present (educated about call when she needed to get up and nurse stated she did not need a chair alarm.) Nurse Communication: Mobility status PT Visit Diagnosis: Muscle weakness (generalized) (M62.81)    Time: 2992-4268 PT  Time Calculation (min) (ACUTE ONLY): 45 min   Charges:   PT Evaluation $PT Eval Low Complexity: 1 Low PT Treatments $Gait Training: 8-22 mins $Therapeutic Activity: 8-22 mins        Gatha Mayer, PT, MPT Acute Rehabilitation Services Office: [phone] If a weekend: [facility] w/e pager [phone] 10/03/2022   Clide Dales 10/03/2022, 12:34 PM

## 2022-10-03 NOTE — TOC Initial Note (Signed)
Transition of Care [facility]) - Initial/Assessment Note    Patient Details  Name: Madison Doyle MRN: [id number] Date of Birth: 01-Sep-1936  [facility]) CM/SW Contact:    Vassie Moselle, LCSW Phone Number: 10/03/2022, 1:17 PM  Clinical Narrative:                 CSW spoke with pt's son and daughter in law over the phone and confirmed plans for home health. Pt has not had home health services before and family does not have a preference for [facility]. Pt's family are also interested in information for home health and caregivers that they can explore if additional support is needed. Information has been placed on pt's chart to be provided at discharge.  HHPT/OT/Aide has been arranged with [facility]. HH orders will need to be placed prior to discharge. No DME needs identified.   Expected Discharge Plan: [place] Barriers to Discharge: No Barriers Identified   Patient Goals and CMS Choice Patient states their goals for this hospitalization and ongoing recovery are:: For pt to return home CMS Medicare.gov Compare Post Acute Care list provided to:: Patient Represenative (must comment) (Son) Choice offered to / list presented to : Adult Children  Expected Discharge Plan and Services Expected Discharge Plan: [place] In-house Referral: NA Discharge Planning Services: CM Consult Post Acute Care Choice: [place] arrangements for the past 2 months: Apartment                 DME Arranged: N/A DME Agency: NA       HH Arranged: PT, OT, Nurse's Aide [place] Agency: [place] Date [place]: 10/03/22 Time [place]: 86 Representative spoke with at [place]: Georgina Snell  Prior Living Arrangements/Services Living arrangements for the past 2 months: Apartment Lives with:: Self Patient language and need for interpreter reviewed:: Yes Do you feel safe going back to the place where you live?: Yes      Need for  Family Participation in Patient Care: Yes (Comment) Care giver support system in place?: No (comment) Current home services: DME Criminal Activity/Legal Involvement Pertinent to Current Situation/Hospitalization: No - Comment as needed  Activities of Daily Living Home Assistive Devices/Equipment: Gilford Rile (specify type) ADL Screening (condition at time of admission) Patient's cognitive ability adequate to safely complete daily activities?: No Is the patient deaf or have difficulty hearing?: Yes Does the patient have difficulty seeing, even when wearing glasses/contacts?: Yes Does the patient have difficulty concentrating, remembering, or making decisions?: No Patient able to express need for assistance with ADLs?: Yes Does the patient have difficulty dressing or bathing?: Yes Independently performs ADLs?: Yes (appropriate for developmental age) Does the patient have difficulty walking or climbing stairs?: Yes Weakness of Legs: Both Weakness of Arms/Hands: Both  Permission Sought/Granted Permission sought to share information with : Facility Sport and exercise psychologist, Family Supports Permission granted to share information with : Yes, Verbal Permission Granted  Share Information with NAME: Heinz Knuckles     Permission granted to share info w Relationship: Son  Permission granted to share info w Contact Information: [phone]  Emotional Assessment Appearance:: Appears stated age Attitude/Demeanor/Rapport: Unable to Assess Affect (typically observed): Unable to Assess Orientation: : Oriented to Self, Oriented to Place Alcohol / Substance Use: Not Applicable Psych Involvement: No (comment)  Admission diagnosis:  Hypokalemia [E87.6] Hyponatremia [E87.1] COVID [U07.1] 2019 novel coronavirus disease (COVID-19) [U07.1] Patient Active Problem List   Diagnosis Date Noted  2019 novel coronavirus disease (COVID-19) 10/02/2022   Hypokalemia 10/02/2022   Hyponatremia 10/02/2022    Ventricular bigeminy 10/02/2022   CSF leak 10/02/2022   Pulmonary nodules 03/26/2022   Pain of left hip joint 03/25/2020   Dysphonia 03/26/2019   Intracranial hypotension 10/08/2018   Ischemic stroke ([place]) 06/05/2018   Dizziness 06/05/2018   Multinodular goiter 02/04/2015   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   History of Hashimoto thyroiditis 01/07/2015   Hypertension 11/17/2014   Personal history of other malignant neoplasm of skin 03/31/2013   Osteoporosis 10/24/2012   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

## 2022-10-03 NOTE — Evaluation (Signed)
Occupational Therapy Evaluation Patient Details Name: Madison Doyle MRN: [id number] DOB: [birth year] Today's Date: 10/03/2022   History of Present Illness Patient is a 86 y.o. female with presented to the emergency department due to having abnormal labs her PCP notified her of sodium was 119 and COVID + since 09/30/2022. PMH: medical history significant of hypertension, hypothyroidism, pulmonary nodules, CSF leak   Clinical Impression   Patient is a 86 year old female who was admitted for above. Patient was noted to have increased weakness and fatigue during session. Patient lives at home alone and would not have support in next level of care. Patient voiced concerns about energy levels to be able to make meals and care for self at home.patient was educated on recommendation to have caregiver support at home with current level of fatigue. Patient was noted to have decreased functional activity tolerance, decreased endurance, decreased standing balance, decreased safety awareness, and decreased knowledge of AD/AE impacting participation in ADLs. Patient would continue to benefit from skilled OT services at this time while admitted and after d/c to address noted deficits in order to improve overall safety and independence in ADLs.       Recommendations for follow up therapy are one component of a multi-disciplinary discharge planning process, led by the attending physician.  Recommendations may be updated based on patient status, additional functional criteria and insurance authorization.   Follow Up Recommendations  Home health OT     Assistance Recommended at Discharge Frequent or constant Supervision/Assistance  Patient can return home with the following Assistance with cooking/housework;Direct supervision/assist for medications management;Assist for transportation;Help with stairs or ramp for entrance;Direct supervision/assist for financial management;A little help with walking and/or  transfers;A little help with bathing/dressing/bathroom    Functional Status Assessment  Patient has had a recent decline in their functional status and demonstrates the ability to make significant improvements in function in a reasonable and predictable amount of time.  Equipment Recommendations  None recommended by OT    Recommendations for Other Services       Precautions / Restrictions Restrictions Weight Bearing Restrictions: No      Mobility Bed Mobility Overal bed mobility: Modified Independent                  Transfers                          Balance Overall balance assessment: Needs assistance Sitting-balance support: No upper extremity supported, Feet supported Sitting balance-Leahy Scale: Fair     Standing balance support: Bilateral upper extremity supported, During functional activity Standing balance-Leahy Scale: Fair                             ADL either performed or assessed with clinical judgement   ADL Overall ADL's : Needs assistance/impaired Eating/Feeding: Modified independent;Sitting Eating/Feeding Details (indicate cue type and reason): taking sips of water with no straw Grooming: Supervision/safety;Sitting;Brushing hair Grooming Details (indicate cue type and reason): EOB Upper Body Bathing: Set up;Sitting   Lower Body Bathing: Set up;Sitting/lateral leans Lower Body Bathing Details (indicate cue type and reason): figure four positioning Upper Body Dressing : Sitting;Set up   Lower Body Dressing: Sitting/lateral leans;Set up   Toilet Transfer: Min guard;Ambulation;Rolling walker (2 wheels) Toilet Transfer Details (indicate cue type and reason): with patient able to complete two laps from bed to commode during session with increased SOB noted. patient was educated  on importance of claming down breathing when SOB was noted even with O2 in 90s. patient verbalized understanding. Toileting- Water quality scientist and  Hygiene: Min guard;Sit to/from stand               Vision Baseline Vision/History: 1 Wears glasses Patient Visual Report: No change from baseline       Perception     Praxis      Pertinent Vitals/Pain Pain Assessment Pain Assessment: No/denies pain     Hand Dominance     Extremity/Trunk Assessment Upper Extremity Assessment Upper Extremity Assessment: Overall WFL for tasks assessed   Lower Extremity Assessment Lower Extremity Assessment: Defer to PT evaluation   Cervical / Trunk Assessment Cervical / Trunk Assessment: Normal   Communication Communication Communication: HOH   Cognition Arousal/Alertness: Awake/alert Behavior During Therapy: WFL for tasks assessed/performed Overall Cognitive Status: Within Functional Limits for tasks assessed                                 General Comments: noted to have anxiousness during session     General Comments       Exercises     Shoulder Instructions      Home Living Family/patient expects to be discharged to:: Private residence Living Arrangements: Alone Available Help at Discharge: Family Type of Home: House Home Access: Stairs to enter Technical brewer of Steps: 2-3 Entrance Stairs-Rails: Right Home Layout: One level         Bathroom Toilet: Standard Bathroom Accessibility: Yes   Home Equipment: Rollator (4 wheels)          Prior Functioning/Environment Prior Level of Function : Independent/Modified Independent;Needs assist               ADLs Comments: Pt states she performs all ADLS indepedently until recently not feeeling well. She has someone come in and help with house cleaning.  Family helps with groceries.        OT Problem List: Decreased activity tolerance;Impaired balance (sitting and/or standing);Decreased safety awareness;Decreased knowledge of precautions      OT Treatment/Interventions: Self-care/ADL training;Energy conservation;Therapeutic  exercise;DME and/or AE instruction;Therapeutic activities;Patient/family education;Balance training    OT Goals(Current goals can be found in the care plan section) Acute Rehab OT Goals Patient Stated Goal: to get better OT Goal Formulation: With patient Time For Goal Achievement: 10/17/22 Potential to Achieve Goals: Fair  OT Frequency: Min 2X/week    Co-evaluation              AM-PAC OT "6 Clicks" Daily Activity     Outcome Measure Help from another person eating meals?: None Help from another person taking care of personal grooming?: A Little Help from another person toileting, which includes using toliet, bedpan, or urinal?: A Little Help from another person bathing (including washing, rinsing, drying)?: A Little Help from another person to put on and taking off regular upper body clothing?: None Help from another person to put on and taking off regular lower body clothing?: A Little 6 Click Score: 20   End of Session Equipment Utilized During Treatment: Rolling walker (2 wheels) Nurse Communication: Mobility status  Activity Tolerance: Patient tolerated treatment well Patient left: in bed;with call bell/phone within reach;with bed alarm set  OT Visit Diagnosis: Unsteadiness on feet (R26.81);Muscle weakness (generalized) (M62.81)                Time: 5732-2025 OT Time Calculation (min): 33 min Charges:  OT General Charges $OT Visit: 1 Visit OT Evaluation $OT Eval Low Complexity: 1 Low OT Treatments $Self Care/Home Management : 8-22 mins  Rennie Plowman, MS Acute Rehabilitation Department Office# [phone]   Willa Rough 10/03/2022, 4:31 PM

## 2022-10-03 NOTE — Progress Notes (Signed)
PROGRESS NOTE    Madison Doyle  NOB:[id number] DOB: September 07, 1936 DOA: 10/02/2022 PCP: Ginger Organ., MD   Brief Narrative:  86 y.o. female with medical history significant of hypertension, hypothyroidism, pulmonary nodules, CSF leak who presented to the emergency department due to having abnormal labs.  Her PCP called her today and told her that her sodium was 119.  The patient tested +3 days ago for COVID.  She was started on molnupiravir and Levaquin 3 days ago.  She was started on prednisone yesterday.  Patient was tested positive for COVID-19 here in the hospital, elevated D-dimer and CRP.  Chest x-ray showed isolated pulmonary nodule in the left midlung   Assessment & Plan:  Principal Problem:   2019 novel coronavirus disease (COVID-19) Active Problems:   Hypertension   Hyperlipidemia   Pulmonary nodules   Postoperative hypothyroidism   Hypokalemia   Hyponatremia   Ventricular bigeminy   CSF leak    COVID-19 infection Outpatient patient was started on Levaquin, molnupiravir and prednisone.  Will check BNP, procalcitonin.  Paxlovid and prednisone ordered upon admission.  Will order as needed bronchodilators, I-S/flutter valve.  Zinc and vitamin C ordered.  Supportive care.  Out of bed to chair.   Hypokalemia/hyponatremia This appears to have resolved     Ventricular bigeminy Stable, continue metoprolol     Hypertension HCTZ on hold.  Continue metoprolol.  IV as needed ordered     Hyperlipidemia Not on any home meds     Pulmonary nodules Outpatient follow-up with PCP     Postoperative hypothyroidism On home Synthroid     CSF leak Follow-up with [facility] as scheduled.        DVT prophylaxis: Lovenox Code Status: Full code Family Communication: Son and DIL is present at bedside  Status is: Observation Patient still feels significantly weak.  Needs PT/OT evaluation, supportive care.   Subjective: Sitting up in the recliner.  Feels slightly better than  yesterday but still quite a bit of weakness   Examination:  General exam: Appears calm and comfortable  Respiratory system: Mild bibasilar crackles Cardiovascular system: S1 & S2 heard, RRR. No JVD, murmurs, rubs, gallops or clicks. No pedal edema. Gastrointestinal system: Abdomen is nondistended, soft and nontender. No organomegaly or masses felt. Normal bowel sounds heard. Central nervous system: Alert and oriented. No focal neurological deficits. Extremities: Symmetric 4 x 5 power. Skin: No rashes, lesions or ulcers Psychiatry: Judgement and insight appear normal. Mood & affect appropriate.     Objective: Vitals:   10/02/22 1430 10/02/22 1537 10/02/22 2250 10/03/22 0351  BP: 136/79 136/82 129/62 102/60  Pulse: 78 74 70 65  Resp: '16 20 20 15  '$ Temp: 98.3 F (36.8 C) 98.2 F (36.8 C) 98 F (36.7 C) 98.2 F (36.8 C)  TempSrc: Oral Oral    SpO2: 100% 99% 97% 96%  Weight:      Height:        Intake/Output Summary (Last 24 hours) at 10/03/2022 0804 Last data filed at 10/02/2022 1800 Gross per 24 hour  Intake 187.61 ml  Output --  Net 187.61 ml   Filed Weights   10/02/22 1048  Weight: 61.2 kg     Data Reviewed:   CBC: Recent Labs  Lab 10/02/22 1116 10/03/22 0536  WBC 6.5 5.1  NEUTROABS 5.2 3.6  HGB 13.1 11.5*  HCT 36.3 33.5*  MCV 85.0 88.4  PLT 203 947   Basic Metabolic Panel: Recent Labs  Lab 10/02/22 1116 10/02/22 1152  10/03/22 0536  NA 125* 127* 136  K 2.8* 2.8* 3.6  CL 84* 85* 98  CO2 '27 28 30  '$ GLUCOSE 118* 111* 96  BUN '19 19 14  '$ CREATININE 0.83 0.81 0.62  CALCIUM 8.9 8.9 8.7*  MG 1.8  --  2.2  PHOS  --   --  2.9   GFR: Estimated Creatinine Clearance: 49 mL/min (by C-G formula based on SCr of 0.62 mg/dL). Liver Function Tests: Recent Labs  Lab 10/02/22 1152 10/03/22 0536  AST 44* 36  ALT 14 14  ALKPHOS 54 46  BILITOT 0.8 0.7  PROT 7.1 6.0*  ALBUMIN 3.5 2.9*   No results for input(s): "LIPASE", "AMYLASE" in the last 168  hours. No results for input(s): "AMMONIA" in the last 168 hours. Coagulation Profile: No results for input(s): "INR", "PROTIME" in the last 168 hours. Cardiac Enzymes: No results for input(s): "CKTOTAL", "CKMB", "CKMBINDEX", "TROPONINI" in the last 168 hours. BNP (last 3 results) No results for input(s): "PROBNP" in the last 8760 hours. HbA1C: No results for input(s): "HGBA1C" in the last 72 hours. CBG: No results for input(s): "GLUCAP" in the last 168 hours. Lipid Profile: No results for input(s): "CHOL", "HDL", "LDLCALC", "TRIG", "CHOLHDL", "LDLDIRECT" in the last 72 hours. Thyroid Function Tests: No results for input(s): "TSH", "T4TOTAL", "FREET4", "T3FREE", "THYROIDAB" in the last 72 hours. Anemia Panel: Recent Labs    10/02/22 1626 10/03/22 0536  FERRITIN 277 212   Sepsis Labs: No results for input(s): "PROCALCITON", "LATICACIDVEN" in the last 168 hours.  Recent Results (from the past 240 hour(s))  Resp Panel by RT-PCR (Flu A&B, Covid) Anterior Nasal Swab     Status: Abnormal   Collection Time: 10/02/22 11:16 AM   Specimen: Anterior Nasal Swab  Result Value Ref Range Status   SARS Coronavirus 2 by RT PCR POSITIVE (A) NEGATIVE Final    Comment: (NOTE) SARS-CoV-2 target nucleic acids are DETECTED.  The SARS-CoV-2 RNA is generally detectable in upper respiratory specimens during the acute phase of infection. Positive results are indicative of the presence of the identified virus, but do not rule out bacterial infection or co-infection with other pathogens not detected by the test. Clinical correlation with patient history and other diagnostic information is necessary to determine patient infection status. The expected result is Negative.  Fact Sheet for Patients: EntrepreneurPulse.com.au  Fact Sheet for Healthcare Providers: IncredibleEmployment.be  This test is not yet approved or cleared by the [place] FDA and  has been  authorized for detection and/or diagnosis of SARS-CoV-2 by FDA under an Emergency Use Authorization (EUA).  This EUA will remain in effect (meaning this test can be used) for the duration of  the COVID-19 declaration under Section 564(b)(1) of the A ct, 21 U.S.C. section 360bbb-3(b)(1), unless the authorization is terminated or revoked sooner.     Influenza A by PCR NEGATIVE NEGATIVE Final   Influenza B by PCR NEGATIVE NEGATIVE Final    Comment: (NOTE) The Xpert Xpress SARS-CoV-2/FLU/RSV plus assay is intended as an aid in the diagnosis of influenza from Nasopharyngeal swab specimens and should not be used as a sole basis for treatment. Nasal washings and aspirates are unacceptable for Xpert Xpress SARS-CoV-2/FLU/RSV testing.  Fact Sheet for Patients: EntrepreneurPulse.com.au  Fact Sheet for Healthcare Providers: IncredibleEmployment.be  This test is not yet approved or cleared by the [place] FDA and has been authorized for detection and/or diagnosis of SARS-CoV-2 by FDA under an Emergency Use Authorization (EUA). This EUA will remain in effect (  meaning this test can be used) for the duration of the COVID-19 declaration under Section 564(b)(1) of the Act, 21 U.S.C. section 360bbb-3(b)(1), unless the authorization is terminated or revoked.  Performed at [facility], [place] [address] [place], [place] [address]   Blood culture (routine x 2)     Status: None (Preliminary result)   Collection Time: 10/02/22 11:52 AM   Specimen: BLOOD  Result Value Ref Range Status   Specimen Description   Final    BLOOD BLOOD RIGHT FOREARM Performed at [facility] [address] [place], [place] [address]    Special Requests   Final    BOTTLES DRAWN AEROBIC AND ANAEROBIC Blood Culture adequate volume Performed at [facility] [address] [place], [place] [address]    Culture   Final     NO GROWTH < 24 HOURS Performed at [facility] [address], [place], [place] [address]    Report Status PENDING  Incomplete  Blood culture (routine x 2)     Status: None (Preliminary result)   Collection Time: 10/02/22  4:26 PM   Specimen: BLOOD LEFT ARM  Result Value Ref Range Status   Specimen Description BLOOD LEFT ARM  Final   Special Requests IN PEDIATRIC BOTTLE Blood Culture adequate volume  Final   Culture   Final    NO GROWTH < 12 HOURS Performed at [facility], [place] [address] [place],  [address]    Report Status PENDING  Incomplete         Radiology Studies: DG Chest Port 1 View  Result Date: 10/02/2022 CLINICAL DATA:  Cough and fever.  COVID positive. EXAM: PORTABLE CHEST 1 VIEW COMPARISON:  None Available. FINDINGS: The lungs are clear without focal pneumonia, edema, pneumothorax or pleural effusion. Pulmonary nodule identified left mid lung. Probable staple line right upper lobe. The cardiopericardial silhouette is within normal limits for size. Bones are diffusely demineralized. IMPRESSION: 1. Isolated pulmonary nodule in the left mid lung. CT chest without contrast recommended to further evaluate. 2. No evidence for focal airspace consolidation or pulmonary edema. 3. Probable staple line right upper lobe suggest prior wedge resection. Electronically Signed   By: Misty Stanley M.D.   On: 10/02/2022 11:47        Scheduled Meds:  vitamin C  500 mg Oral Daily   enoxaparin (LOVENOX) injection  40 mg Subcutaneous Q24H   levothyroxine  88 mcg Oral Q0600   metoprolol succinate  25 mg Oral Daily   nirmatrelvir/ritonavir EUA  3 tablet Oral BID   predniSONE  40 mg Oral Q breakfast   zinc sulfate  220 mg Oral Daily   Continuous Infusions:  sodium chloride Stopped (10/02/22 1457)     LOS: 0 days   Time spent= 35 mins    Rakesha Dalporto Arsenio Loader, MD [facility]  If 7PM-7AM, please contact night-coverage  10/03/2022, 8:04 AM

## 2022-10-04 DIAGNOSIS — I498 Other specified cardiac arrhythmias: Secondary | ICD-10-CM | POA: Diagnosis not present

## 2022-10-04 DIAGNOSIS — E871 Hypo-osmolality and hyponatremia: Secondary | ICD-10-CM | POA: Diagnosis not present

## 2022-10-04 DIAGNOSIS — G9600 Cerebrospinal fluid leak, unspecified: Secondary | ICD-10-CM

## 2022-10-04 DIAGNOSIS — E890 Postprocedural hypothyroidism: Secondary | ICD-10-CM

## 2022-10-04 DIAGNOSIS — I10 Essential (primary) hypertension: Secondary | ICD-10-CM

## 2022-10-04 DIAGNOSIS — R918 Other nonspecific abnormal finding of lung field: Secondary | ICD-10-CM

## 2022-10-04 DIAGNOSIS — R5381 Other malaise: Secondary | ICD-10-CM

## 2022-10-04 DIAGNOSIS — E876 Hypokalemia: Secondary | ICD-10-CM | POA: Diagnosis not present

## 2022-10-04 DIAGNOSIS — U071 COVID-19: Secondary | ICD-10-CM | POA: Diagnosis not present

## 2022-10-04 DIAGNOSIS — R531 Weakness: Secondary | ICD-10-CM

## 2022-10-04 LAB — COMPREHENSIVE METABOLIC PANEL
ALT: 13 U/L (ref 0–44)
AST: 28 U/L (ref 15–41)
Albumin: 3.2 g/dL — ABNORMAL LOW (ref 3.5–5.0)
Alkaline Phosphatase: 47 U/L (ref 38–126)
Anion gap: 8 (ref 5–15)
BUN: 16 mg/dL (ref 8–23)
CO2: 28 mmol/L (ref 22–32)
Calcium: 8.6 mg/dL — ABNORMAL LOW (ref 8.9–10.3)
Chloride: 96 mmol/L — ABNORMAL LOW (ref 98–111)
Creatinine, Ser: 0.73 mg/dL (ref 0.44–1.00)
GFR, Estimated: >60 mL/min (ref >60)
Glucose, Bld: 102 mg/dL — ABNORMAL HIGH (ref 70–99)
Potassium: 3.2 mmol/L — ABNORMAL LOW (ref 3.5–5.1)
Sodium: 132 mmol/L — ABNORMAL LOW (ref 135–145)
Total Bilirubin: 0.7 mg/dL (ref 0.3–1.2)
Total Protein: 6 g/dL — ABNORMAL LOW (ref 6.5–8.1)

## 2022-10-04 LAB — CBC WITH DIFFERENTIAL/PLATELET
Abs Immature Granulocytes: 0.09 10*3/uL — ABNORMAL HIGH (ref 0.00–0.07)
Basophils Absolute: 0 10*3/uL (ref 0.0–0.1)
Basophils Relative: 0 %
Eosinophils Absolute: 0 10*3/uL (ref 0.0–0.5)
Eosinophils Relative: 0 %
HCT: 33.3 % — ABNORMAL LOW (ref 36.0–46.0)
Hemoglobin: 11.3 g/dL — ABNORMAL LOW (ref 12.0–15.0)
Immature Granulocytes: 1 %
Lymphocytes Relative: 12 %
Lymphs Abs: 0.9 10*3/uL (ref 0.7–4.0)
MCH: 30.2 pg (ref 26.0–34.0)
MCHC: 33.9 g/dL (ref 30.0–36.0)
MCV: 89 fL (ref 80.0–100.0)
Monocytes Absolute: 0.9 10*3/uL (ref 0.1–1.0)
Monocytes Relative: 12 %
Neutro Abs: 5.6 10*3/uL (ref 1.7–7.7)
Neutrophils Relative %: 75 %
Platelets: 237 10*3/uL (ref 150–400)
RBC: 3.74 MIL/uL — ABNORMAL LOW (ref 3.87–5.11)
RDW: 11.9 % (ref 11.5–15.5)
WBC: 7.5 10*3/uL (ref 4.0–10.5)
nRBC: 0 % (ref 0.0–0.2)

## 2022-10-04 LAB — MAGNESIUM: Magnesium: 2.1 mg/dL (ref 1.7–2.4)

## 2022-10-04 LAB — C-REACTIVE PROTEIN: CRP: 4.7 mg/dL — ABNORMAL HIGH (ref <1.0)

## 2022-10-04 LAB — D-DIMER, QUANTITATIVE: D-Dimer, Quant: 0.43 ug/mL-FEU (ref 0.00–0.50)

## 2022-10-04 LAB — PHOSPHORUS: Phosphorus: 2.2 mg/dL — ABNORMAL LOW (ref 2.5–4.6)

## 2022-10-04 LAB — FERRITIN: Ferritin: 160 ng/mL (ref 11–307)

## 2022-10-04 MED ORDER — POTASSIUM CHLORIDE CRYS ER 20 MEQ PO TBCR
40.0000 meq | EXTENDED_RELEASE_TABLET | ORAL | Status: AC
  Administered 2022-10-04 (×2): 40 meq via ORAL
  Filled 2022-10-04 (×2): qty 2

## 2022-10-04 NOTE — Progress Notes (Signed)
PROGRESS NOTE  Madison Doyle WTU:[id number] DOB: Dec 28, 1935   PCP: Ginger Organ., MD  Patient is from: Home.  Lives alone.  Uses walker at baseline.  DOA: 10/02/2022 LOS: 0  Chief complaints Chief Complaint  Patient presents with   abnormal labs     Brief Narrative / Interim history: 86 y.o. female with medical history significant of hypertension, hypothyroidism, pulmonary nodules, CSF leak who presented to the emergency department due to hyponatremia to 119 at PCP office.  Patient tested positive for COVID-19 3 days prior.   She was started on molnupiravir and Levaquin 3 days ago and steroid taper the day prior to presentation.   In ED, stable vitals with normal saturation on RA. Na 127.  K2.8.  CBC and troponin within normal.  With 19 PCR positive.  D-dimer and CRP elevated.   Chest x-ray showed isolated pulmonary nodule in the left midlung.  Patient was started on steroid, Paxlovid, IV fluid and potassium supplementation, and admitted.  Subjective: Seen and examined earlier this morning.  No major events overnight of this morning.  Feels better this morning but not quite close to baseline from breathing and weakness standpoint.  She is not requiring oxygen.  She reports productive cough with whitish phlegm.  Patient and family anxious about going home yet.  She lives by herself.  Objective: Vitals:   10/03/22 1420 10/03/22 1450 10/03/22 2044 10/04/22 0530  BP:   125/70 133/66  Pulse: 71  68 67  Resp:   18 18  Temp:   98.2 F (36.8 C) 98 F (36.7 C)  TempSrc:   Oral Oral  SpO2: 100% 100% 97% 94%  Weight:      Height:        Examination:  GENERAL: No apparent distress.  Nontoxic. HEENT: MMM.  Vision and hearing grossly intact.  NECK: Supple.  No apparent JVD.  RESP:  No IWOB.  Fair aeration bilaterally. CVS:  RRR. Heart sounds normal.  ABD/GI/GU: BS+. Abd soft, NTND.  MSK/EXT:  Moves extremities. No apparent deformity.  Trace BLE edema. SKIN: no apparent skin  lesion or wound NEURO: Awake, alert and oriented appropriately.  No apparent focal neuro deficit. PSYCH: Calm. Normal affect.   Procedures:  None  Microbiology summarized: COVID-19 PCR positive.  Assessment and plan: Principal Problem:   2019 novel coronavirus disease (COVID-19) Active Problems:   Hypertension   Hyperlipidemia   Pulmonary nodules   Postoperative hypothyroidism   Hypokalemia   Hyponatremia   Ventricular bigeminy   CSF leak  COVID-19 infection: Initially tested positive on 11/18 PCP office. Started on Levaquin and molnupiravir.  Prednisone taper added to the day prior to presentation.  Vital stable.  No oxygen requirement.  Inflammatory markers elevated but improving. -Continue prednisone and Paxlovid -Hold home Levaquin.  Low suspicion for bacterial pneumonia. -Ambulatory saturation, incentive spirometry, OOB/PT/OT -Supportive care with inhalers, antitussive, mucolytic's, vitamin C and zinc -Continue isolation precaution  Hypokalemia/hyponatremia: Seems she is on HCTZ at home.  Improved. -Continue holding HCTZ -Monitor replenish as appropriate   Ventricular bigeminy -Continue metoprolol -Optimize electrolytes   Essential hypertension -Continue metoprolol.  -Continue holding HCTZ   Pulmonary nodules -Outpatient follow-up with PCP   Postoperative hypothyroidism -On home Synthroid   CSF leak -Follow-up with [facility] as scheduled.  Generalized weakness/physical deconditioning -PT/OT-recommended home health.  Body mass index is 21.79 kg/m.          DVT prophylaxis:  enoxaparin (LOVENOX) injection 40 mg Start: 10/02/22 2200  Code  Status: Full code Family Communication: Updated patient's son and daughter-in-law at bedside Level of care: Telemetry Status is: Observation The patient will require care spanning > 2 midnights and should be moved to inpatient because: COVID-19 infection, electrolyte derangements   Final disposition: Likely home   Consultants:  None  Sch Meds:  Scheduled Meds:  vitamin C  500 mg Oral Daily   enoxaparin (LOVENOX) injection  40 mg Subcutaneous Q24H   levothyroxine  88 mcg Oral Q0600   metoprolol succinate  25 mg Oral Daily   nirmatrelvir/ritonavir EUA  3 tablet Oral BID   potassium chloride  40 mEq Oral Q4H   predniSONE  40 mg Oral Q breakfast   zinc sulfate  220 mg Oral Daily   Continuous Infusions:  sodium chloride Stopped (10/02/22 1457)   PRN Meds:.sodium chloride, acetaminophen **OR** acetaminophen, albuterol, chlorpheniramine-HYDROcodone, guaiFENesin-dextromethorphan, hydrALAZINE, metoprolol tartrate, ondansetron **OR** ondansetron (ZOFRAN) IV, senna-docusate, traZODone  Antimicrobials: Anti-infectives (From admission, onward)    Start     Dose/Rate Route Frequency Ordered Stop   10/02/22 1700  nirmatrelvir/ritonavir EUA (PAXLOVID) 3 tablet       Note to Pharmacy: Please adjust or change therapy as needed.  Thank you.   3 tablet Oral 2 times daily 10/02/22 1534 10/07/22 2159        I have personally reviewed the following labs and images: CBC: Recent Labs  Lab 10/02/22 1116 10/03/22 0536 10/04/22 0645  WBC 6.5 5.1 7.5  NEUTROABS 5.2 3.6 5.6  HGB 13.1 11.5* 11.3*  HCT 36.3 33.5* 33.3*  MCV 85.0 88.4 89.0  PLT 203 207 237   BMP &GFR Recent Labs  Lab 10/02/22 1116 10/02/22 1152 10/03/22 0536 10/04/22 0645  NA 125* 127* 136 132*  K 2.8* 2.8* 3.6 3.2*  CL 84* 85* 98 96*  CO2 '27 28 30 28  '$ GLUCOSE 118* 111* 96 102*  BUN '19 19 14 16  '$ CREATININE 0.83 0.81 0.62 0.73  CALCIUM 8.9 8.9 8.7* 8.6*  MG 1.8  --  2.2 2.1  PHOS  --   --  2.9 2.2*   Estimated Creatinine Clearance: 49 mL/min (by C-G formula based on SCr of 0.73 mg/dL). Liver & Pancreas: Recent Labs  Lab 10/02/22 1152 10/03/22 0536 10/04/22 0645  AST 44* 36 28  ALT '14 14 13  '$ ALKPHOS 54 46 47  BILITOT 0.8 0.7 0.7  PROT 7.1 6.0* 6.0*  ALBUMIN 3.5 2.9* 3.2*   No results for input(s): "LIPASE",  "AMYLASE" in the last 168 hours. No results for input(s): "AMMONIA" in the last 168 hours. Diabetic: No results for input(s): "HGBA1C" in the last 72 hours. No results for input(s): "GLUCAP" in the last 168 hours. Cardiac Enzymes: No results for input(s): "CKTOTAL", "CKMB", "CKMBINDEX", "TROPONINI" in the last 168 hours. No results for input(s): "PROBNP" in the last 8760 hours. Coagulation Profile: No results for input(s): "INR", "PROTIME" in the last 168 hours. Thyroid Function Tests: No results for input(s): "TSH", "T4TOTAL", "FREET4", "T3FREE", "THYROIDAB" in the last 72 hours. Lipid Profile: No results for input(s): "CHOL", "HDL", "LDLCALC", "TRIG", "CHOLHDL", "LDLDIRECT" in the last 72 hours. Anemia Panel: Recent Labs    10/03/22 0536 10/04/22 0645  FERRITIN 212 160   Urine analysis: No results found for: "COLORURINE", "APPEARANCEUR", "LABSPEC", "PHURINE", "GLUCOSEU", "HGBUR", "BILIRUBINUR", "KETONESUR", "PROTEINUR", "UROBILINOGEN", "NITRITE", "LEUKOCYTESUR" Sepsis Labs: Invalid input(s): "PROCALCITONIN", "LACTICIDVEN"  Microbiology: Recent Results (from the past 240 hour(s))  Resp Panel by RT-PCR (Flu A&B, Covid) Anterior Nasal Swab     Status: Abnormal  Collection Time: 10/02/22 11:16 AM   Specimen: Anterior Nasal Swab  Result Value Ref Range Status   SARS Coronavirus 2 by RT PCR POSITIVE (A) NEGATIVE Final    Comment: (NOTE) SARS-CoV-2 target nucleic acids are DETECTED.  The SARS-CoV-2 RNA is generally detectable in upper respiratory specimens during the acute phase of infection. Positive results are indicative of the presence of the identified virus, but do not rule out bacterial infection or co-infection with other pathogens not detected by the test. Clinical correlation with patient history and other diagnostic information is necessary to determine patient infection status. The expected result is Negative.  Fact Sheet for  Patients: EntrepreneurPulse.com.au  Fact Sheet for Healthcare Providers: IncredibleEmployment.be  This test is not yet approved or cleared by the [place] FDA and  has been authorized for detection and/or diagnosis of SARS-CoV-2 by FDA under an Emergency Use Authorization (EUA).  This EUA will remain in effect (meaning this test can be used) for the duration of  the COVID-19 declaration under Section 564(b)(1) of the A ct, 21 U.S.C. section 360bbb-3(b)(1), unless the authorization is terminated or revoked sooner.     Influenza A by PCR NEGATIVE NEGATIVE Final   Influenza B by PCR NEGATIVE NEGATIVE Final    Comment: (NOTE) The Xpert Xpress SARS-CoV-2/FLU/RSV plus assay is intended as an aid in the diagnosis of influenza from Nasopharyngeal swab specimens and should not be used as a sole basis for treatment. Nasal washings and aspirates are unacceptable for Xpert Xpress SARS-CoV-2/FLU/RSV testing.  Fact Sheet for Patients: EntrepreneurPulse.com.au  Fact Sheet for Healthcare Providers: IncredibleEmployment.be  This test is not yet approved or cleared by the [place] FDA and has been authorized for detection and/or diagnosis of SARS-CoV-2 by FDA under an Emergency Use Authorization (EUA). This EUA will remain in effect (meaning this test can be used) for the duration of the COVID-19 declaration under Section 564(b)(1) of the Act, 21 U.S.C. section 360bbb-3(b)(1), unless the authorization is terminated or revoked.  Performed at [facility], [place] [address] [place], [place] [address]   Blood culture (routine x 2)     Status: None (Preliminary result)   Collection Time: 10/02/22 11:52 AM   Specimen: BLOOD  Result Value Ref Range Status   Specimen Description   Final    BLOOD BLOOD RIGHT FOREARM Performed at [facility] [address], [place], [place]  [address]    Special Requests   Final    BOTTLES DRAWN AEROBIC AND ANAEROBIC Blood Culture adequate volume Performed at [facility] [address], [place], [place] [address]    Culture   Final    NO GROWTH < 24 HOURS Performed at [facility] [address] [place], [place] [address]    Report Status PENDING  Incomplete  Blood culture (routine x 2)     Status: None (Preliminary result)   Collection Time: 10/02/22  4:26 PM   Specimen: BLOOD LEFT ARM  Result Value Ref Range Status   Specimen Description BLOOD LEFT ARM  Final   Special Requests IN PEDIATRIC BOTTLE Blood Culture adequate volume  Final   Culture   Final    NO GROWTH < 12 HOURS Performed at [facility], [place] [address] [place], [place] [address]    Report Status PENDING  Incomplete    Radiology Studies: No results found.    Laird Runnion T. [place]  If 7PM-7AM, please contact night-coverage [email or web address] 10/04/2022, 1:15  PM

## 2022-10-04 NOTE — Progress Notes (Signed)
Mobility Specialist - Progress Note   10/04/22 1408  Mobility  Activity Ambulated with assistance in hallway  Level of Assistance Modified independent, requires aide device or extra time  Assistive Device Front wheel walker  Distance Ambulated (ft) 60 ft  Activity Response Tolerated well  Mobility Referral Yes  $Mobility charge 1 Mobility   Nurse requested Mobility Specialist to perform oxygen saturation test with pt which includes removing pt from oxygen both at rest and while ambulating.  Below are the results from that testing.     Patient Saturations on Room Air at Rest = spO2 100% Patient Saturations on Room Air while Ambulating = sp02 97% .  At end of testing pt left in room on 0 Liters of oxygen.  Reported results to nurse.   Pt received in chair and agreed to mobility. No c/o pain nor discomfort. Pt left in bed with all needs met.   Roderick Pee Mobility Specialist

## 2022-10-05 DIAGNOSIS — R918 Other nonspecific abnormal finding of lung field: Secondary | ICD-10-CM | POA: Diagnosis not present

## 2022-10-05 DIAGNOSIS — U071 COVID-19: Secondary | ICD-10-CM | POA: Diagnosis not present

## 2022-10-05 DIAGNOSIS — E876 Hypokalemia: Secondary | ICD-10-CM | POA: Diagnosis not present

## 2022-10-05 DIAGNOSIS — E785 Hyperlipidemia, unspecified: Secondary | ICD-10-CM

## 2022-10-05 DIAGNOSIS — E890 Postprocedural hypothyroidism: Secondary | ICD-10-CM | POA: Diagnosis not present

## 2022-10-05 DIAGNOSIS — I498 Other specified cardiac arrhythmias: Secondary | ICD-10-CM | POA: Diagnosis not present

## 2022-10-05 LAB — PHOSPHORUS: Phosphorus: 2.7 mg/dL (ref 2.5–4.6)

## 2022-10-05 LAB — CBC WITH DIFFERENTIAL/PLATELET
Abs Immature Granulocytes: 0.26 10*3/uL — ABNORMAL HIGH (ref 0.00–0.07)
Basophils Absolute: 0 10*3/uL (ref 0.0–0.1)
Basophils Relative: 0 %
Eosinophils Absolute: 0 10*3/uL (ref 0.0–0.5)
Eosinophils Relative: 0 %
HCT: 34.2 % — ABNORMAL LOW (ref 36.0–46.0)
Hemoglobin: 11.7 g/dL — ABNORMAL LOW (ref 12.0–15.0)
Immature Granulocytes: 3 %
Lymphocytes Relative: 14 %
Lymphs Abs: 1.1 10*3/uL (ref 0.7–4.0)
MCH: 30.5 pg (ref 26.0–34.0)
MCHC: 34.2 g/dL (ref 30.0–36.0)
MCV: 89.1 fL (ref 80.0–100.0)
Monocytes Absolute: 0.9 10*3/uL (ref 0.1–1.0)
Monocytes Relative: 11 %
Neutro Abs: 5.9 10*3/uL (ref 1.7–7.7)
Neutrophils Relative %: 72 %
Platelets: 257 10*3/uL (ref 150–400)
RBC: 3.84 MIL/uL — ABNORMAL LOW (ref 3.87–5.11)
RDW: 12 % (ref 11.5–15.5)
WBC: 8.2 10*3/uL (ref 4.0–10.5)
nRBC: 0 % (ref 0.0–0.2)

## 2022-10-05 LAB — COMPREHENSIVE METABOLIC PANEL
ALT: 13 U/L (ref 0–44)
AST: 22 U/L (ref 15–41)
Albumin: 3.2 g/dL — ABNORMAL LOW (ref 3.5–5.0)
Alkaline Phosphatase: 48 U/L (ref 38–126)
Anion gap: 8 (ref 5–15)
BUN: 13 mg/dL (ref 8–23)
CO2: 27 mmol/L (ref 22–32)
Calcium: 8.8 mg/dL — ABNORMAL LOW (ref 8.9–10.3)
Chloride: 100 mmol/L (ref 98–111)
Creatinine, Ser: 0.63 mg/dL (ref 0.44–1.00)
GFR, Estimated: >60 mL/min (ref >60)
Glucose, Bld: 88 mg/dL (ref 70–99)
Potassium: 3.8 mmol/L (ref 3.5–5.1)
Sodium: 135 mmol/L (ref 135–145)
Total Bilirubin: 0.6 mg/dL (ref 0.3–1.2)
Total Protein: 6.1 g/dL — ABNORMAL LOW (ref 6.5–8.1)

## 2022-10-05 LAB — MAGNESIUM: Magnesium: 2 mg/dL (ref 1.7–2.4)

## 2022-10-05 LAB — C-REACTIVE PROTEIN: CRP: 2.7 mg/dL — ABNORMAL HIGH (ref <1.0)

## 2022-10-05 LAB — D-DIMER, QUANTITATIVE: D-Dimer, Quant: 0.43 ug/mL-FEU (ref 0.00–0.50)

## 2022-10-05 MED ORDER — ZINC SULFATE 220 (50 ZN) MG PO CAPS: 220.0000 mg | ORAL_CAPSULE | Freq: Every day | ORAL | 0 refills | Status: AC

## 2022-10-05 MED ORDER — GUAIFENESIN-DM 100-10 MG/5ML PO SYRP: 10.0000 mL | ORAL_SOLUTION | Freq: Four times a day (QID) | ORAL | 0 refills | Status: AC | PRN

## 2022-10-05 MED ORDER — ASCORBIC ACID 500 MG PO TABS: 500.0000 mg | ORAL_TABLET | Freq: Every day | ORAL | Status: AC

## 2022-10-05 MED ORDER — SENNOSIDES-DOCUSATE SODIUM 8.6-50 MG PO TABS: 1.0000 | ORAL_TABLET | Freq: Every evening | ORAL | Status: DC | PRN

## 2022-10-05 MED ORDER — HYDROCHLOROTHIAZIDE 12.5 MG PO TABS: 12.5000 mg | ORAL_TABLET | Freq: Every day | ORAL | Status: DC

## 2022-10-05 NOTE — Progress Notes (Signed)
Mobility Specialist - Progress Note   10/05/22 0915  Mobility  Activity Ambulated with assistance in room  Level of Assistance Independent after set-up  Assistive Device BSC  Distance Ambulated (ft) 5 ft  Activity Response Tolerated well  Mobility Referral Yes  $Mobility charge 1 Mobility   Pt received in bed and agreed to transfer to [facility]. Pt required assistance with bed mobility and hygiene. NT and RN took over when pt was on [facility].   Roderick Pee Mobility Specialist

## 2022-10-05 NOTE — Discharge Summary (Signed)
Physician Discharge Summary  Promiss Labarbera Cabral CVE:[id number] DOB: 1936/08/01 DOA: 10/02/2022  PCP: Ginger Organ., MD  Admit date: 10/02/2022 Discharge date: 10/05/2022 Admitted From: Home Disposition: Home Recommendations for Outpatient Follow-up:  Follow up with PCP in 1 to 2 weeks Check BMP, CBC and blood pressure at follow-up Recommend CT chest or repeat chest x-ray in 4 to 6 weeks for left middle lung nodule Please follow up on the following pending results: None  Home Health: PT/OT/aide Equipment/Devices: Patient has appropriate DME.  Discharge Condition: Stable CODE STATUS: Full code  Follow-up Information     [facility], [facility] Follow up.   Specialty: Home Health Services Why: Alvis Lemmings will follow up with you at discharge to provide home health services. Contact information: [place] [address] [phone]         Ginger Organ., MD. Schedule an appointment as soon as possible for a visit in 1 week(s).   Specialty: Internal Medicine Contact information: [place] [address] [facility] course 86 y.o. female with medical history significant of hypertension, hypothyroidism, pulmonary nodules, CSF leak who presented to the emergency department due to hyponatremia to 119 at PCP office.  Patient tested positive for COVID-19 3 days prior.   She was started on molnupiravir and Levaquin 3 days ago and steroid taper the day prior to presentation.    In ED, stable vitals with normal saturation on RA. Na 127.  K2.8.  CBC and troponin within normal.  With 19 PCR positive.  D-dimer and CRP elevated.   Chest x-ray showed isolated pulmonary nodule in the left midlung.  Patient was started on steroid, Paxlovid, IV fluid and potassium supplementation, and admitted.  Patient was continued on steroid, Paxlovid and breathing treatments.  Electrolyte derangements resolved with aggressive  replenishment.  On the day of discharge, patient felt better and ready to go home.  Ambulated on room air and maintained appropriate saturation.  She completed antiviral course.  She will continue home prednisone taper.  Discontinued hydrochlorothiazide due to electrolyte regiment until follow-up with PCP.  Home health PT/OT/aide ordered as recommended by therapy.  Patient has rolling walker at home.  See individual problem list below for more.   Problems addressed during this hospitalization Principal Problem:   2019 novel coronavirus disease (COVID-19) Active Problems:   Hypertension   Hyperlipidemia   Pulmonary nodules   Postoperative hypothyroidism   Hypokalemia   Hyponatremia   Ventricular bigeminy   CSF leak              Vital signs Vitals:   10/04/22 0530 10/04/22 1326 10/04/22 1919 10/05/22 0511  BP: 133/66 135/77 (!) 119/99 (!) 146/82  Pulse: 67 65 73 71  Temp: 98 F (36.7 C) 98.3 F (36.8 C) (!) 97.5 F (36.4 C) 98 F (36.7 C)  Resp: '18 16 18 16  '$ Height:      Weight:      SpO2: 94% 100% 100% 96%  TempSrc: Oral Oral Oral Oral  BMI (Calculated):         Discharge exam  GENERAL: No apparent distress.  Nontoxic. HEENT: MMM.  Vision and hearing grossly intact.  NECK: Supple.  No apparent JVD.  RESP:  No IWOB.  Fair aeration bilaterally. CVS:  RRR. Heart sounds normal.  ABD/GI/GU: BS+. Abd soft, NTND.  MSK/EXT:  Moves extremities. No apparent  deformity. No edema.  SKIN: no apparent skin lesion or wound NEURO: Awake and alert. Oriented appropriately.  No apparent focal neuro deficit. PSYCH: Calm. Normal affect.   Discharge Instructions Discharge Instructions     Call MD for:  difficulty breathing, headache or visual disturbances   Complete by: As directed    Call MD for:  extreme fatigue   Complete by: As directed    Call MD for:  persistant dizziness or light-headedness   Complete by: As directed    Diet general   Complete by: As directed     Discharge instructions   Complete by: As directed    It has been a pleasure taking care of you!  You were hospitalized due to COVID-19 infection, low sodium and potassium level for which you have been treated.  Your sodium and potassium has normalized.  You may continue your prednisone until you finish the course.  We recommend holding your hydrochlorothiazide for at least for 1 week.  Keep yourself hydrated.  Review your new medication list and the directions on your medications before you take them.  Follow-up with your primary care doctor in 1 to 2 weeks or sooner if needed.   Take care,   Increase activity slowly   Complete by: As directed       Allergies as of 10/05/2022       Reactions   Fexofenadine Hcl Hives        Medication List     STOP taking these medications    levofloxacin 500 MG tablet Commonly known as: LEVAQUIN       TAKE these medications    albuterol 108 (90 Base) MCG/ACT inhaler Commonly known as: VENTOLIN HFA Inhale 2 puffs into the lungs every 6 (six) hours as needed for shortness of breath or wheezing.   ascorbic acid 500 MG tablet Commonly known as: VITAMIN C Take 1 tablet (500 mg total) by mouth daily for 5 days.   CALCIUM PO Take 1 tablet by mouth daily.   guaiFENesin-dextromethorphan 100-10 MG/5ML syrup Commonly known as: ROBITUSSIN DM Take 10 mLs by mouth every 6 (six) hours as needed for up to 3 days for cough.   hydrochlorothiazide 12.5 MG tablet Commonly known as: HYDRODIURIL Take 1 tablet (12.5 mg total) by mouth daily. Start taking on: October 12, 2022 What changed: These instructions start on October 12, 2022. If you are unsure what to do until then, ask your doctor or other care provider.   Lagevrio 200 MG Caps capsule Generic drug: molnupiravir EUA Take 4 capsules by mouth 2 (two) times daily.   levothyroxine 88 MCG tablet Commonly known as: SYNTHROID Take 88 mcg by mouth daily before breakfast.   metoprolol  succinate 25 MG 24 hr tablet Commonly known as: TOPROL-XL Take 25 mg by mouth daily.   multivitamin with minerals Tabs tablet Take 1 tablet by mouth daily.   omega-3 acid ethyl esters 1 g capsule Commonly known as: LOVAZA Take by mouth 2 (two) times daily.   predniSONE 10 MG (21) Tbpk tablet Commonly known as: STERAPRED UNI-PAK 21 TAB Take 10 mg by mouth See admin instructions. Take 6 tablets by mouth on day 1, then take 5 tablets by mouth on day 2, take 4 tablets by mouth on day 3, take 3 tablets by mouth on day 4, then take 2 tablets by mouth on day 5 per daughter in law.   senna-docusate 8.6-50 MG tablet Commonly known as: Senokot-S Take 1 tablet by mouth at  bedtime as needed for moderate constipation.   zinc sulfate 220 (50 Zn) MG capsule Take 1 capsule (220 mg total) by mouth daily for 5 days.        Consultations: None  Procedures/Studies:   DG Chest Port 1 View  Result Date: 10/02/2022 CLINICAL DATA:  Cough and fever.  COVID positive. EXAM: PORTABLE CHEST 1 VIEW COMPARISON:  None Available. FINDINGS: The lungs are clear without focal pneumonia, edema, pneumothorax or pleural effusion. Pulmonary nodule identified left mid lung. Probable staple line right upper lobe. The cardiopericardial silhouette is within normal limits for size. Bones are diffusely demineralized. IMPRESSION: 1. Isolated pulmonary nodule in the left mid lung. CT chest without contrast recommended to further evaluate. 2. No evidence for focal airspace consolidation or pulmonary edema. 3. Probable staple line right upper lobe suggest prior wedge resection. Electronically Signed   By: Misty Stanley M.D.   On: 10/02/2022 11:47       The results of significant diagnostics from this hospitalization (including imaging, microbiology, ancillary and laboratory) are listed below for reference.     Microbiology: Recent Results (from the past 240 hour(s))  Resp Panel by RT-PCR (Flu A&B, Covid) Anterior Nasal  Swab     Status: Abnormal   Collection Time: 10/02/22 11:16 AM   Specimen: Anterior Nasal Swab  Result Value Ref Range Status   SARS Coronavirus 2 by RT PCR POSITIVE (A) NEGATIVE Final    Comment: (NOTE) SARS-CoV-2 target nucleic acids are DETECTED.  The SARS-CoV-2 RNA is generally detectable in upper respiratory specimens during the acute phase of infection. Positive results are indicative of the presence of the identified virus, but do not rule out bacterial infection or co-infection with other pathogens not detected by the test. Clinical correlation with patient history and other diagnostic information is necessary to determine patient infection status. The expected result is Negative.  Fact Sheet for Patients: EntrepreneurPulse.com.au  Fact Sheet for Healthcare Providers: IncredibleEmployment.be  This test is not yet approved or cleared by the [place] FDA and  has been authorized for detection and/or diagnosis of SARS-CoV-2 by FDA under an Emergency Use Authorization (EUA).  This EUA will remain in effect (meaning this test can be used) for the duration of  the COVID-19 declaration under Section 564(b)(1) of the A ct, 21 U.S.C. section 360bbb-3(b)(1), unless the authorization is terminated or revoked sooner.     Influenza A by PCR NEGATIVE NEGATIVE Final   Influenza B by PCR NEGATIVE NEGATIVE Final    Comment: (NOTE) The Xpert Xpress SARS-CoV-2/FLU/RSV plus assay is intended as an aid in the diagnosis of influenza from Nasopharyngeal swab specimens and should not be used as a sole basis for treatment. Nasal washings and aspirates are unacceptable for Xpert Xpress SARS-CoV-2/FLU/RSV testing.  Fact Sheet for Patients: EntrepreneurPulse.com.au  Fact Sheet for Healthcare Providers: IncredibleEmployment.be  This test is not yet approved or cleared by the [place] FDA and has been authorized  for detection and/or diagnosis of SARS-CoV-2 by FDA under an Emergency Use Authorization (EUA). This EUA will remain in effect (meaning this test can be used) for the duration of the COVID-19 declaration under Section 564(b)(1) of the Act, 21 U.S.C. section 360bbb-3(b)(1), unless the authorization is terminated or revoked.  Performed at [facility], [place] [address], [place], [place] [address]   Blood culture (routine x 2)     Status: None (Preliminary result)   Collection Time: 10/02/22 11:52 AM   Specimen: BLOOD  Result Value Ref  Range Status   Specimen Description   Final    BLOOD BLOOD RIGHT FOREARM Performed at [place] [address], [place], [place] [address]    Special Requests   Final    BOTTLES DRAWN AEROBIC AND ANAEROBIC Blood Culture adequate volume Performed at [place] [address], [place], [place] [address]    Culture   Final    NO GROWTH 3 DAYS Performed at [facility], [place] [address] [place], [place] [address]    Report Status PENDING  Incomplete  Blood culture (routine x 2)     Status: None (Preliminary result)   Collection Time: 10/02/22  4:26 PM   Specimen: BLOOD LEFT ARM  Result Value Ref Range Status   Specimen Description BLOOD LEFT ARM  Final   Special Requests IN PEDIATRIC BOTTLE Blood Culture adequate volume  Final   Culture   Final    NO GROWTH 3 DAYS Performed at [facility], [place] [address], [place],  [address]    Report Status PENDING  Incomplete     Labs:  CBC: Recent Labs  Lab 10/02/22 1116 10/03/22 0536 10/04/22 0645 10/05/22 0506  WBC 6.5 5.1 7.5 8.2  NEUTROABS 5.2 3.6 5.6 5.9  HGB 13.1 11.5* 11.3* 11.7*  HCT 36.3 33.5* 33.3* 34.2*  MCV 85.0 88.4 89.0 89.1  PLT 203 207 237 257   BMP &GFR Recent Labs  Lab 10/02/22 1116 10/02/22 1152 10/03/22 0536 10/04/22 0645 10/05/22 0506  NA 125* 127* 136 132* 135  K 2.8* 2.8* 3.6 3.2* 3.8  CL  84* 85* 98 96* 100  CO2 '27 28 30 28 27  '$ GLUCOSE 118* 111* 96 102* 88  BUN '19 19 14 16 13  '$ CREATININE 0.83 0.81 0.62 0.73 0.63  CALCIUM 8.9 8.9 8.7* 8.6* 8.8*  MG 1.8  --  2.2 2.1 2.0  PHOS  --   --  2.9 2.2* 2.7   Estimated Creatinine Clearance: 49 mL/min (by C-G formula based on SCr of 0.63 mg/dL). Liver & Pancreas: Recent Labs  Lab 10/02/22 1152 10/03/22 0536 10/04/22 0645 10/05/22 0506  AST 44* 36 28 22  ALT '14 14 13 13  '$ ALKPHOS 54 46 47 48  BILITOT 0.8 0.7 0.7 0.6  PROT 7.1 6.0* 6.0* 6.1*  ALBUMIN 3.5 2.9* 3.2* 3.2*   No results for input(s): "LIPASE", "AMYLASE" in the last 168 hours. No results for input(s): "AMMONIA" in the last 168 hours. Diabetic: No results for input(s): "HGBA1C" in the last 72 hours. No results for input(s): "GLUCAP" in the last 168 hours. Cardiac Enzymes: No results for input(s): "CKTOTAL", "CKMB", "CKMBINDEX", "TROPONINI" in the last 168 hours. No results for input(s): "PROBNP" in the last 8760 hours. Coagulation Profile: No results for input(s): "INR", "PROTIME" in the last 168 hours. Thyroid Function Tests: No results for input(s): "TSH", "T4TOTAL", "FREET4", "T3FREE", "THYROIDAB" in the last 72 hours. Lipid Profile: No results for input(s): "CHOL", "HDL", "LDLCALC", "TRIG", "CHOLHDL", "LDLDIRECT" in the last 72 hours. Anemia Panel: Recent Labs    10/03/22 0536 10/04/22 0645  FERRITIN 212 160   Urine analysis: No results found for: "COLORURINE", "APPEARANCEUR", "LABSPEC", "PHURINE", "GLUCOSEU", "HGBUR", "BILIRUBINUR", "KETONESUR", "PROTEINUR", "UROBILINOGEN", "NITRITE", "LEUKOCYTESUR" Sepsis Labs: Invalid input(s): "PROCALCITONIN", "LACTICIDVEN"   SIGNED:  Mercy Riding, MD  [facility] 10/05/2022, 3:07 PM

## 2022-10-07 LAB — CULTURE, BLOOD (ROUTINE X 2)
Culture: NO GROWTH
Culture: NO GROWTH
Special Requests: ADEQUATE
Special Requests: ADEQUATE

## 2022-10-11 DIAGNOSIS — C3491 Malignant neoplasm of unspecified part of right bronchus or lung: Secondary | ICD-10-CM | POA: Diagnosis not present

## 2022-10-11 DIAGNOSIS — R911 Solitary pulmonary nodule: Secondary | ICD-10-CM | POA: Diagnosis not present

## 2022-10-11 DIAGNOSIS — C3492 Malignant neoplasm of unspecified part of left bronchus or lung: Secondary | ICD-10-CM | POA: Diagnosis not present

## 2022-10-11 DIAGNOSIS — U071 COVID-19: Secondary | ICD-10-CM | POA: Diagnosis not present

## 2022-11-14 DIAGNOSIS — R918 Other nonspecific abnormal finding of lung field: Secondary | ICD-10-CM | POA: Diagnosis not present

## 2022-11-14 DIAGNOSIS — G9608 Other cranial cerebrospinal fluid leak: Secondary | ICD-10-CM | POA: Diagnosis not present

## 2022-11-14 DIAGNOSIS — G96810 Intracranial hypotension, unspecified: Secondary | ICD-10-CM | POA: Diagnosis not present

## 2022-11-14 DIAGNOSIS — I878 Other specified disorders of veins: Secondary | ICD-10-CM | POA: Diagnosis not present

## 2022-11-14 DIAGNOSIS — Z01810 Encounter for preprocedural cardiovascular examination: Secondary | ICD-10-CM | POA: Diagnosis not present

## 2022-11-14 DIAGNOSIS — N281 Cyst of kidney, acquired: Secondary | ICD-10-CM | POA: Diagnosis not present

## 2022-12-03 DIAGNOSIS — R918 Other nonspecific abnormal finding of lung field: Secondary | ICD-10-CM | POA: Diagnosis not present

## 2022-12-03 DIAGNOSIS — C3432 Malignant neoplasm of lower lobe, left bronchus or lung: Secondary | ICD-10-CM | POA: Diagnosis not present

## 2022-12-03 DIAGNOSIS — J701 Chronic and other pulmonary manifestations due to radiation: Secondary | ICD-10-CM | POA: Diagnosis not present

## 2022-12-03 DIAGNOSIS — C342 Malignant neoplasm of middle lobe, bronchus or lung: Secondary | ICD-10-CM | POA: Diagnosis not present

## 2022-12-13 DIAGNOSIS — G96810 Intracranial hypotension, unspecified: Secondary | ICD-10-CM | POA: Diagnosis not present

## 2022-12-13 DIAGNOSIS — R51 Headache with orthostatic component, not elsewhere classified: Secondary | ICD-10-CM | POA: Diagnosis not present

## 2022-12-25 DIAGNOSIS — I700 Atherosclerosis of aorta: Secondary | ICD-10-CM | POA: Diagnosis not present

## 2022-12-25 DIAGNOSIS — E039 Hypothyroidism, unspecified: Secondary | ICD-10-CM | POA: Diagnosis not present

## 2022-12-25 DIAGNOSIS — D352 Benign neoplasm of pituitary gland: Secondary | ICD-10-CM | POA: Diagnosis not present

## 2023-01-16 DIAGNOSIS — H524 Presbyopia: Secondary | ICD-10-CM | POA: Diagnosis not present

## 2023-01-24 DIAGNOSIS — E039 Hypothyroidism, unspecified: Secondary | ICD-10-CM | POA: Diagnosis not present

## 2023-01-24 DIAGNOSIS — I129 Hypertensive chronic kidney disease with stage 1 through stage 4 chronic kidney disease, or unspecified chronic kidney disease: Secondary | ICD-10-CM | POA: Diagnosis not present

## 2023-01-24 DIAGNOSIS — N1831 Chronic kidney disease, stage 3a: Secondary | ICD-10-CM | POA: Diagnosis not present

## 2023-01-24 DIAGNOSIS — M810 Age-related osteoporosis without current pathological fracture: Secondary | ICD-10-CM | POA: Diagnosis not present

## 2023-01-25 ENCOUNTER — Other Ambulatory Visit: Payer: Self-pay | Admitting: Internal Medicine

## 2023-01-25 DIAGNOSIS — Z1231 Encounter for screening mammogram for malignant neoplasm of breast: Secondary | ICD-10-CM

## 2023-01-31 DIAGNOSIS — K080 Exfoliation of teeth due to systemic causes: Secondary | ICD-10-CM | POA: Diagnosis not present

## 2023-02-04 DIAGNOSIS — L821 Other seborrheic keratosis: Secondary | ICD-10-CM | POA: Diagnosis not present

## 2023-02-04 DIAGNOSIS — D1801 Hemangioma of skin and subcutaneous tissue: Secondary | ICD-10-CM | POA: Diagnosis not present

## 2023-02-04 DIAGNOSIS — L820 Inflamed seborrheic keratosis: Secondary | ICD-10-CM | POA: Diagnosis not present

## 2023-02-04 DIAGNOSIS — L570 Actinic keratosis: Secondary | ICD-10-CM | POA: Diagnosis not present

## 2023-02-04 DIAGNOSIS — I8312 Varicose veins of left lower extremity with inflammation: Secondary | ICD-10-CM | POA: Diagnosis not present

## 2023-02-04 DIAGNOSIS — Z85828 Personal history of other malignant neoplasm of skin: Secondary | ICD-10-CM | POA: Diagnosis not present

## 2023-02-21 DIAGNOSIS — M546 Pain in thoracic spine: Secondary | ICD-10-CM | POA: Diagnosis not present

## 2023-03-08 DIAGNOSIS — M546 Pain in thoracic spine: Secondary | ICD-10-CM | POA: Diagnosis not present

## 2023-03-12 ENCOUNTER — Ambulatory Visit
Admission: RE | Admit: 2023-03-12 | Discharge: 2023-03-12 | Disposition: A | Discharge status: Home / Self Care | Payer: Medicare Other | Source: Ambulatory Visit | Attending: Internal Medicine | Admitting: Internal Medicine

## 2023-03-12 DIAGNOSIS — Z1231 Encounter for screening mammogram for malignant neoplasm of breast: Secondary | ICD-10-CM | POA: Diagnosis not present

## 2023-03-19 DIAGNOSIS — M546 Pain in thoracic spine: Secondary | ICD-10-CM | POA: Diagnosis not present

## 2023-04-02 DIAGNOSIS — H25013 Cortical age-related cataract, bilateral: Secondary | ICD-10-CM | POA: Diagnosis not present

## 2023-04-02 DIAGNOSIS — H2513 Age-related nuclear cataract, bilateral: Secondary | ICD-10-CM | POA: Diagnosis not present

## 2023-04-02 DIAGNOSIS — H25043 Posterior subcapsular polar age-related cataract, bilateral: Secondary | ICD-10-CM | POA: Diagnosis not present

## 2023-04-02 DIAGNOSIS — H2512 Age-related nuclear cataract, left eye: Secondary | ICD-10-CM | POA: Diagnosis not present

## 2023-04-02 DIAGNOSIS — H18413 Arcus senilis, bilateral: Secondary | ICD-10-CM | POA: Diagnosis not present

## 2023-04-17 DIAGNOSIS — M546 Pain in thoracic spine: Secondary | ICD-10-CM | POA: Diagnosis not present

## 2023-04-23 DIAGNOSIS — M546 Pain in thoracic spine: Secondary | ICD-10-CM | POA: Diagnosis not present

## 2023-05-01 DIAGNOSIS — H04123 Dry eye syndrome of bilateral lacrimal glands: Secondary | ICD-10-CM | POA: Diagnosis not present

## 2023-05-01 DIAGNOSIS — H02889 Meibomian gland dysfunction of unspecified eye, unspecified eyelid: Secondary | ICD-10-CM | POA: Diagnosis not present

## 2023-05-02 DIAGNOSIS — M546 Pain in thoracic spine: Secondary | ICD-10-CM | POA: Diagnosis not present

## 2023-05-07 DIAGNOSIS — M546 Pain in thoracic spine: Secondary | ICD-10-CM | POA: Diagnosis not present

## 2023-05-14 DIAGNOSIS — C44529 Squamous cell carcinoma of skin of other part of trunk: Secondary | ICD-10-CM | POA: Diagnosis not present

## 2023-05-14 DIAGNOSIS — B079 Viral wart, unspecified: Secondary | ICD-10-CM | POA: Diagnosis not present

## 2023-05-14 DIAGNOSIS — B078 Other viral warts: Secondary | ICD-10-CM | POA: Diagnosis not present

## 2023-05-14 DIAGNOSIS — M546 Pain in thoracic spine: Secondary | ICD-10-CM | POA: Diagnosis not present

## 2023-05-14 DIAGNOSIS — C44722 Squamous cell carcinoma of skin of right lower limb, including hip: Secondary | ICD-10-CM | POA: Diagnosis not present

## 2023-05-28 DIAGNOSIS — M546 Pain in thoracic spine: Secondary | ICD-10-CM | POA: Diagnosis not present

## 2023-06-03 DIAGNOSIS — Z923 Personal history of irradiation: Secondary | ICD-10-CM | POA: Diagnosis not present

## 2023-06-03 DIAGNOSIS — C342 Malignant neoplasm of middle lobe, bronchus or lung: Secondary | ICD-10-CM | POA: Diagnosis not present

## 2023-06-03 DIAGNOSIS — C3432 Malignant neoplasm of lower lobe, left bronchus or lung: Secondary | ICD-10-CM | POA: Diagnosis not present

## 2023-06-03 DIAGNOSIS — R911 Solitary pulmonary nodule: Secondary | ICD-10-CM | POA: Diagnosis not present

## 2023-06-03 DIAGNOSIS — Z08 Encounter for follow-up examination after completed treatment for malignant neoplasm: Secondary | ICD-10-CM | POA: Diagnosis not present

## 2023-06-03 DIAGNOSIS — Z85118 Personal history of other malignant neoplasm of bronchus and lung: Secondary | ICD-10-CM | POA: Diagnosis not present

## 2023-06-04 DIAGNOSIS — M546 Pain in thoracic spine: Secondary | ICD-10-CM | POA: Diagnosis not present

## 2023-06-04 DIAGNOSIS — H2513 Age-related nuclear cataract, bilateral: Secondary | ICD-10-CM | POA: Diagnosis not present

## 2023-06-04 DIAGNOSIS — H02889 Meibomian gland dysfunction of unspecified eye, unspecified eyelid: Secondary | ICD-10-CM | POA: Diagnosis not present

## 2023-06-04 DIAGNOSIS — H04123 Dry eye syndrome of bilateral lacrimal glands: Secondary | ICD-10-CM | POA: Diagnosis not present

## 2023-06-06 DIAGNOSIS — I129 Hypertensive chronic kidney disease with stage 1 through stage 4 chronic kidney disease, or unspecified chronic kidney disease: Secondary | ICD-10-CM | POA: Diagnosis not present

## 2023-06-07 DIAGNOSIS — G9609 Other spinal cerebrospinal fluid leak: Secondary | ICD-10-CM | POA: Diagnosis not present

## 2023-06-07 DIAGNOSIS — G96810 Intracranial hypotension, unspecified: Secondary | ICD-10-CM | POA: Diagnosis not present

## 2023-06-07 DIAGNOSIS — R51 Headache with orthostatic component, not elsewhere classified: Secondary | ICD-10-CM | POA: Diagnosis not present

## 2023-06-11 DIAGNOSIS — M546 Pain in thoracic spine: Secondary | ICD-10-CM | POA: Diagnosis not present

## 2023-06-13 DIAGNOSIS — M546 Pain in thoracic spine: Secondary | ICD-10-CM | POA: Diagnosis not present

## 2023-06-25 DIAGNOSIS — M546 Pain in thoracic spine: Secondary | ICD-10-CM | POA: Diagnosis not present

## 2023-07-01 DIAGNOSIS — I129 Hypertensive chronic kidney disease with stage 1 through stage 4 chronic kidney disease, or unspecified chronic kidney disease: Secondary | ICD-10-CM | POA: Diagnosis not present

## 2023-07-08 DIAGNOSIS — H2512 Age-related nuclear cataract, left eye: Secondary | ICD-10-CM | POA: Diagnosis not present

## 2023-07-09 DIAGNOSIS — H2511 Age-related nuclear cataract, right eye: Secondary | ICD-10-CM | POA: Diagnosis not present

## 2023-07-22 DIAGNOSIS — H25011 Cortical age-related cataract, right eye: Secondary | ICD-10-CM | POA: Diagnosis not present

## 2023-07-22 DIAGNOSIS — H2511 Age-related nuclear cataract, right eye: Secondary | ICD-10-CM | POA: Diagnosis not present

## 2023-07-22 DIAGNOSIS — H25041 Posterior subcapsular polar age-related cataract, right eye: Secondary | ICD-10-CM | POA: Diagnosis not present

## 2023-07-29 DIAGNOSIS — E039 Hypothyroidism, unspecified: Secondary | ICD-10-CM | POA: Diagnosis not present

## 2023-07-29 DIAGNOSIS — I129 Hypertensive chronic kidney disease with stage 1 through stage 4 chronic kidney disease, or unspecified chronic kidney disease: Secondary | ICD-10-CM | POA: Diagnosis not present

## 2023-07-29 DIAGNOSIS — N1831 Chronic kidney disease, stage 3a: Secondary | ICD-10-CM | POA: Diagnosis not present

## 2023-07-29 DIAGNOSIS — M810 Age-related osteoporosis without current pathological fracture: Secondary | ICD-10-CM | POA: Diagnosis not present

## 2023-07-29 DIAGNOSIS — H2511 Age-related nuclear cataract, right eye: Secondary | ICD-10-CM | POA: Diagnosis not present

## 2023-08-05 DIAGNOSIS — Z85828 Personal history of other malignant neoplasm of skin: Secondary | ICD-10-CM | POA: Diagnosis not present

## 2023-08-05 DIAGNOSIS — L821 Other seborrheic keratosis: Secondary | ICD-10-CM | POA: Diagnosis not present

## 2023-08-05 DIAGNOSIS — L820 Inflamed seborrheic keratosis: Secondary | ICD-10-CM | POA: Diagnosis not present

## 2023-08-05 DIAGNOSIS — D692 Other nonthrombocytopenic purpura: Secondary | ICD-10-CM | POA: Diagnosis not present

## 2023-08-05 DIAGNOSIS — D1801 Hemangioma of skin and subcutaneous tissue: Secondary | ICD-10-CM | POA: Diagnosis not present

## 2023-08-07 DIAGNOSIS — Z1339 Encounter for screening examination for other mental health and behavioral disorders: Secondary | ICD-10-CM | POA: Diagnosis not present

## 2023-08-07 DIAGNOSIS — I10 Essential (primary) hypertension: Secondary | ICD-10-CM | POA: Diagnosis not present

## 2023-08-07 DIAGNOSIS — R82998 Other abnormal findings in urine: Secondary | ICD-10-CM | POA: Diagnosis not present

## 2023-08-07 DIAGNOSIS — Z1331 Encounter for screening for depression: Secondary | ICD-10-CM | POA: Diagnosis not present

## 2023-08-07 DIAGNOSIS — Z23 Encounter for immunization: Secondary | ICD-10-CM | POA: Diagnosis not present

## 2023-08-07 DIAGNOSIS — Z0000 Encounter for general adult medical examination without abnormal findings: Secondary | ICD-10-CM | POA: Diagnosis not present

## 2023-08-07 DIAGNOSIS — D352 Benign neoplasm of pituitary gland: Secondary | ICD-10-CM | POA: Diagnosis not present

## 2023-08-07 DIAGNOSIS — I129 Hypertensive chronic kidney disease with stage 1 through stage 4 chronic kidney disease, or unspecified chronic kidney disease: Secondary | ICD-10-CM | POA: Diagnosis not present

## 2023-08-07 DIAGNOSIS — C3492 Malignant neoplasm of unspecified part of left bronchus or lung: Secondary | ICD-10-CM | POA: Diagnosis not present

## 2023-08-12 DIAGNOSIS — H524 Presbyopia: Secondary | ICD-10-CM | POA: Diagnosis not present

## 2023-08-13 DIAGNOSIS — M546 Pain in thoracic spine: Secondary | ICD-10-CM | POA: Diagnosis not present

## 2023-08-19 DIAGNOSIS — K080 Exfoliation of teeth due to systemic causes: Secondary | ICD-10-CM | POA: Diagnosis not present

## 2023-08-26 DIAGNOSIS — H6123 Impacted cerumen, bilateral: Secondary | ICD-10-CM | POA: Diagnosis not present

## 2023-08-27 DIAGNOSIS — M546 Pain in thoracic spine: Secondary | ICD-10-CM | POA: Diagnosis not present

## 2023-09-05 DIAGNOSIS — M546 Pain in thoracic spine: Secondary | ICD-10-CM | POA: Diagnosis not present

## 2023-09-09 DIAGNOSIS — H04123 Dry eye syndrome of bilateral lacrimal glands: Secondary | ICD-10-CM | POA: Diagnosis not present

## 2023-09-09 DIAGNOSIS — H16223 Keratoconjunctivitis sicca, not specified as Sjogren's, bilateral: Secondary | ICD-10-CM | POA: Diagnosis not present

## 2023-09-09 DIAGNOSIS — H35033 Hypertensive retinopathy, bilateral: Secondary | ICD-10-CM | POA: Diagnosis not present

## 2023-09-09 DIAGNOSIS — H02889 Meibomian gland dysfunction of unspecified eye, unspecified eyelid: Secondary | ICD-10-CM | POA: Diagnosis not present

## 2023-09-09 DIAGNOSIS — H02125 Mechanical ectropion of left lower eyelid: Secondary | ICD-10-CM | POA: Diagnosis not present

## 2023-09-10 DIAGNOSIS — M546 Pain in thoracic spine: Secondary | ICD-10-CM | POA: Diagnosis not present

## 2023-09-24 DIAGNOSIS — M546 Pain in thoracic spine: Secondary | ICD-10-CM | POA: Diagnosis not present

## 2023-10-08 DIAGNOSIS — M546 Pain in thoracic spine: Secondary | ICD-10-CM | POA: Diagnosis not present

## 2023-10-24 DIAGNOSIS — M546 Pain in thoracic spine: Secondary | ICD-10-CM | POA: Diagnosis not present

## 2023-11-19 DIAGNOSIS — M546 Pain in thoracic spine: Secondary | ICD-10-CM | POA: Diagnosis not present

## 2023-11-25 DIAGNOSIS — E236 Other disorders of pituitary gland: Secondary | ICD-10-CM | POA: Diagnosis not present

## 2023-11-25 DIAGNOSIS — R51 Headache with orthostatic component, not elsewhere classified: Secondary | ICD-10-CM | POA: Diagnosis not present

## 2023-11-28 DIAGNOSIS — Z85828 Personal history of other malignant neoplasm of skin: Secondary | ICD-10-CM | POA: Diagnosis not present

## 2023-11-28 DIAGNOSIS — L110 Acquired keratosis follicularis: Secondary | ICD-10-CM | POA: Diagnosis not present

## 2023-11-28 DIAGNOSIS — L821 Other seborrheic keratosis: Secondary | ICD-10-CM | POA: Diagnosis not present

## 2023-12-06 DIAGNOSIS — H16223 Keratoconjunctivitis sicca, not specified as Sjogren's, bilateral: Secondary | ICD-10-CM | POA: Diagnosis not present

## 2023-12-06 DIAGNOSIS — H02109 Unspecified ectropion of unspecified eye, unspecified eyelid: Secondary | ICD-10-CM | POA: Diagnosis not present

## 2023-12-09 DIAGNOSIS — Z85118 Personal history of other malignant neoplasm of bronchus and lung: Secondary | ICD-10-CM | POA: Diagnosis not present

## 2023-12-09 DIAGNOSIS — R918 Other nonspecific abnormal finding of lung field: Secondary | ICD-10-CM | POA: Diagnosis not present

## 2023-12-09 DIAGNOSIS — C3432 Malignant neoplasm of lower lobe, left bronchus or lung: Secondary | ICD-10-CM | POA: Diagnosis not present

## 2023-12-09 DIAGNOSIS — C342 Malignant neoplasm of middle lobe, bronchus or lung: Secondary | ICD-10-CM | POA: Diagnosis not present

## 2023-12-09 DIAGNOSIS — J9819 Other pulmonary collapse: Secondary | ICD-10-CM | POA: Diagnosis not present

## 2023-12-09 DIAGNOSIS — J984 Other disorders of lung: Secondary | ICD-10-CM | POA: Diagnosis not present

## 2023-12-09 DIAGNOSIS — Z923 Personal history of irradiation: Secondary | ICD-10-CM | POA: Diagnosis not present

## 2023-12-20 DIAGNOSIS — H02109 Unspecified ectropion of unspecified eye, unspecified eyelid: Secondary | ICD-10-CM | POA: Diagnosis not present

## 2023-12-20 DIAGNOSIS — H16223 Keratoconjunctivitis sicca, not specified as Sjogren's, bilateral: Secondary | ICD-10-CM | POA: Diagnosis not present

## 2024-01-21 DIAGNOSIS — H02109 Unspecified ectropion of unspecified eye, unspecified eyelid: Secondary | ICD-10-CM | POA: Diagnosis not present

## 2024-01-22 DIAGNOSIS — R51 Headache with orthostatic component, not elsewhere classified: Secondary | ICD-10-CM | POA: Diagnosis not present

## 2024-01-22 DIAGNOSIS — G9609 Other spinal cerebrospinal fluid leak: Secondary | ICD-10-CM | POA: Diagnosis not present

## 2024-01-23 DIAGNOSIS — H04123 Dry eye syndrome of bilateral lacrimal glands: Secondary | ICD-10-CM | POA: Diagnosis not present

## 2024-01-23 DIAGNOSIS — H16212 Exposure keratoconjunctivitis, left eye: Secondary | ICD-10-CM | POA: Diagnosis not present

## 2024-01-23 DIAGNOSIS — H02135 Senile ectropion of left lower eyelid: Secondary | ICD-10-CM | POA: Diagnosis not present

## 2024-01-23 DIAGNOSIS — H04522 Eversion of left lacrimal punctum: Secondary | ICD-10-CM | POA: Diagnosis not present

## 2024-02-05 DIAGNOSIS — L820 Inflamed seborrheic keratosis: Secondary | ICD-10-CM | POA: Diagnosis not present

## 2024-02-05 DIAGNOSIS — L821 Other seborrheic keratosis: Secondary | ICD-10-CM | POA: Diagnosis not present

## 2024-02-05 DIAGNOSIS — D1801 Hemangioma of skin and subcutaneous tissue: Secondary | ICD-10-CM | POA: Diagnosis not present

## 2024-02-05 DIAGNOSIS — Z85828 Personal history of other malignant neoplasm of skin: Secondary | ICD-10-CM | POA: Diagnosis not present

## 2024-03-03 DIAGNOSIS — K080 Exfoliation of teeth due to systemic causes: Secondary | ICD-10-CM | POA: Diagnosis not present

## 2024-03-05 DIAGNOSIS — K080 Exfoliation of teeth due to systemic causes: Secondary | ICD-10-CM | POA: Diagnosis not present

## 2024-03-12 DIAGNOSIS — H52223 Regular astigmatism, bilateral: Secondary | ICD-10-CM | POA: Diagnosis not present

## 2024-03-12 DIAGNOSIS — H524 Presbyopia: Secondary | ICD-10-CM | POA: Diagnosis not present

## 2024-03-12 DIAGNOSIS — H35033 Hypertensive retinopathy, bilateral: Secondary | ICD-10-CM | POA: Diagnosis not present

## 2024-03-12 DIAGNOSIS — H5213 Myopia, bilateral: Secondary | ICD-10-CM | POA: Diagnosis not present

## 2024-03-16 DIAGNOSIS — C3492 Malignant neoplasm of unspecified part of left bronchus or lung: Secondary | ICD-10-CM | POA: Diagnosis not present

## 2024-03-17 DIAGNOSIS — R2689 Other abnormalities of gait and mobility: Secondary | ICD-10-CM | POA: Diagnosis not present

## 2024-03-17 DIAGNOSIS — R2681 Unsteadiness on feet: Secondary | ICD-10-CM | POA: Diagnosis not present

## 2024-03-17 DIAGNOSIS — I10 Essential (primary) hypertension: Secondary | ICD-10-CM | POA: Diagnosis not present

## 2024-03-18 DIAGNOSIS — G96810 Intracranial hypotension, unspecified: Secondary | ICD-10-CM | POA: Diagnosis not present

## 2024-03-18 DIAGNOSIS — G9609 Other spinal cerebrospinal fluid leak: Secondary | ICD-10-CM | POA: Diagnosis not present

## 2024-04-21 DIAGNOSIS — R51 Headache with orthostatic component, not elsewhere classified: Secondary | ICD-10-CM | POA: Diagnosis not present

## 2024-04-21 DIAGNOSIS — G96810 Intracranial hypotension, unspecified: Secondary | ICD-10-CM | POA: Diagnosis not present

## 2024-04-21 DIAGNOSIS — Z01818 Encounter for other preprocedural examination: Secondary | ICD-10-CM | POA: Diagnosis not present

## 2024-04-21 DIAGNOSIS — G9609 Other spinal cerebrospinal fluid leak: Secondary | ICD-10-CM | POA: Diagnosis not present

## 2024-05-05 ENCOUNTER — Other Ambulatory Visit: Payer: Self-pay | Admitting: Internal Medicine

## 2024-05-05 DIAGNOSIS — Z1231 Encounter for screening mammogram for malignant neoplasm of breast: Secondary | ICD-10-CM

## 2024-05-20 DIAGNOSIS — G96810 Intracranial hypotension, unspecified: Secondary | ICD-10-CM | POA: Diagnosis not present

## 2024-05-27 ENCOUNTER — Ambulatory Visit
Admission: RE | Admit: 2024-05-27 | Discharge: 2024-05-27 | Disposition: A | Discharge status: Home / Self Care | Source: Ambulatory Visit | Attending: Internal Medicine | Admitting: Internal Medicine

## 2024-05-27 DIAGNOSIS — Z1231 Encounter for screening mammogram for malignant neoplasm of breast: Secondary | ICD-10-CM

## 2024-06-04 DIAGNOSIS — H16223 Keratoconjunctivitis sicca, not specified as Sjogren's, bilateral: Secondary | ICD-10-CM | POA: Diagnosis not present

## 2024-06-11 DIAGNOSIS — J701 Chronic and other pulmonary manifestations due to radiation: Secondary | ICD-10-CM | POA: Diagnosis not present

## 2024-06-11 DIAGNOSIS — J9819 Other pulmonary collapse: Secondary | ICD-10-CM | POA: Diagnosis not present

## 2024-06-11 DIAGNOSIS — R911 Solitary pulmonary nodule: Secondary | ICD-10-CM | POA: Diagnosis not present

## 2024-06-11 DIAGNOSIS — R918 Other nonspecific abnormal finding of lung field: Secondary | ICD-10-CM | POA: Diagnosis not present

## 2024-06-16 ENCOUNTER — Encounter (HOSPITAL_COMMUNITY): Payer: Self-pay | Admitting: Radiology

## 2024-06-16 ENCOUNTER — Emergency Department (HOSPITAL_COMMUNITY)

## 2024-06-16 ENCOUNTER — Observation Stay (HOSPITAL_COMMUNITY)
Admission: EM | Admit: 2024-06-16 | Discharge: 2024-06-18 | Disposition: A | Discharge status: Home Health | Source: Skilled Nursing Facility | Attending: Internal Medicine | Admitting: Internal Medicine

## 2024-06-16 ENCOUNTER — Other Ambulatory Visit: Payer: Self-pay

## 2024-06-16 DIAGNOSIS — Z96649 Presence of unspecified artificial hip joint: Secondary | ICD-10-CM | POA: Diagnosis not present

## 2024-06-16 DIAGNOSIS — R110 Nausea: Secondary | ICD-10-CM | POA: Diagnosis not present

## 2024-06-16 DIAGNOSIS — R42 Dizziness and giddiness: Secondary | ICD-10-CM | POA: Diagnosis not present

## 2024-06-16 DIAGNOSIS — E039 Hypothyroidism, unspecified: Secondary | ICD-10-CM | POA: Diagnosis not present

## 2024-06-16 DIAGNOSIS — G4489 Other headache syndrome: Secondary | ICD-10-CM | POA: Diagnosis not present

## 2024-06-16 DIAGNOSIS — I10 Essential (primary) hypertension: Secondary | ICD-10-CM | POA: Diagnosis not present

## 2024-06-16 DIAGNOSIS — R519 Headache, unspecified: Secondary | ICD-10-CM | POA: Insufficient documentation

## 2024-06-16 DIAGNOSIS — Z7901 Long term (current) use of anticoagulants: Secondary | ICD-10-CM | POA: Diagnosis not present

## 2024-06-16 DIAGNOSIS — Z91040 Latex allergy status: Secondary | ICD-10-CM | POA: Diagnosis not present

## 2024-06-16 DIAGNOSIS — G4459 Other complicated headache syndrome: Principal | ICD-10-CM

## 2024-06-16 DIAGNOSIS — G9600 Cerebrospinal fluid leak, unspecified: Secondary | ICD-10-CM | POA: Diagnosis not present

## 2024-06-16 DIAGNOSIS — H6121 Impacted cerumen, right ear: Secondary | ICD-10-CM | POA: Diagnosis not present

## 2024-06-16 DIAGNOSIS — H9071 Mixed conductive and sensorineural hearing loss, unilateral, right ear, with unrestricted hearing on the contralateral side: Secondary | ICD-10-CM | POA: Diagnosis not present

## 2024-06-16 DIAGNOSIS — H6120 Impacted cerumen, unspecified ear: Secondary | ICD-10-CM | POA: Diagnosis present

## 2024-06-16 DIAGNOSIS — R29818 Other symptoms and signs involving the nervous system: Secondary | ICD-10-CM | POA: Diagnosis not present

## 2024-06-16 DIAGNOSIS — Z9089 Acquired absence of other organs: Secondary | ICD-10-CM | POA: Insufficient documentation

## 2024-06-16 DIAGNOSIS — I6523 Occlusion and stenosis of bilateral carotid arteries: Secondary | ICD-10-CM | POA: Diagnosis not present

## 2024-06-16 DIAGNOSIS — I6381 Other cerebral infarction due to occlusion or stenosis of small artery: Secondary | ICD-10-CM | POA: Diagnosis not present

## 2024-06-16 DIAGNOSIS — G8929 Other chronic pain: Secondary | ICD-10-CM | POA: Diagnosis present

## 2024-06-16 DIAGNOSIS — Z79899 Other long term (current) drug therapy: Secondary | ICD-10-CM | POA: Insufficient documentation

## 2024-06-16 DIAGNOSIS — R1111 Vomiting without nausea: Secondary | ICD-10-CM | POA: Diagnosis not present

## 2024-06-16 LAB — DIFFERENTIAL
Abs Immature Granulocytes: 0.03 K/uL (ref 0.00–0.07)
Basophils Absolute: 0 K/uL (ref 0.0–0.1)
Basophils Relative: 1 %
Eosinophils Absolute: 0 K/uL (ref 0.0–0.5)
Eosinophils Relative: 1 %
Immature Granulocytes: 1 %
Lymphocytes Relative: 13 %
Lymphs Abs: 0.7 K/uL (ref 0.7–4.0)
Monocytes Absolute: 0.4 K/uL (ref 0.1–1.0)
Monocytes Relative: 8 %
Neutro Abs: 3.8 K/uL (ref 1.7–7.7)
Neutrophils Relative %: 76 %

## 2024-06-16 LAB — CBC
HCT: 40.1 % (ref 36.0–46.0)
Hemoglobin: 13.6 g/dL (ref 12.0–15.0)
MCH: 30.4 pg (ref 26.0–34.0)
MCHC: 33.9 g/dL (ref 30.0–36.0)
MCV: 89.5 fL (ref 80.0–100.0)
Platelets: 230 K/uL (ref 150–400)
RBC: 4.48 MIL/uL (ref 3.87–5.11)
RDW: 12.4 % (ref 11.5–15.5)
WBC: 5 K/uL (ref 4.0–10.5)
nRBC: 0 % (ref 0.0–0.2)

## 2024-06-16 LAB — COMPREHENSIVE METABOLIC PANEL WITH GFR
ALT: 9 U/L (ref 0–44)
AST: 31 U/L (ref 15–41)
Albumin: 4 g/dL (ref 3.5–5.0)
Alkaline Phosphatase: 49 U/L (ref 38–126)
Anion gap: 13 (ref 5–15)
BUN: 13 mg/dL (ref 8–23)
CO2: 21 mmol/L — ABNORMAL LOW (ref 22–32)
Calcium: 9.2 mg/dL (ref 8.9–10.3)
Chloride: 98 mmol/L (ref 98–111)
Creatinine, Ser: 0.8 mg/dL (ref 0.44–1.00)
GFR, Estimated: >60 mL/min (ref >60)
Glucose, Bld: 137 mg/dL — ABNORMAL HIGH (ref 70–99)
Potassium: 4.5 mmol/L (ref 3.5–5.1)
Sodium: 132 mmol/L — ABNORMAL LOW (ref 135–145)
Total Bilirubin: 1.1 mg/dL (ref 0.0–1.2)
Total Protein: 6.7 g/dL (ref 6.5–8.1)

## 2024-06-16 LAB — I-STAT CHEM 8, ED
BUN: 16 mg/dL (ref 8–23)
Calcium, Ion: 1.11 mmol/L — ABNORMAL LOW (ref 1.15–1.40)
Chloride: 99 mmol/L (ref 98–111)
Creatinine, Ser: 0.7 mg/dL (ref 0.44–1.00)
Glucose, Bld: 141 mg/dL — ABNORMAL HIGH (ref 70–99)
HCT: 41 % (ref 36.0–46.0)
Hemoglobin: 13.9 g/dL (ref 12.0–15.0)
Potassium: 4.6 mmol/L (ref 3.5–5.1)
Sodium: 132 mmol/L — ABNORMAL LOW (ref 135–145)
TCO2: 24 mmol/L (ref 22–32)

## 2024-06-16 LAB — ETHANOL: Alcohol, Ethyl (B): <15 mg/dL (ref <15)

## 2024-06-16 LAB — APTT: aPTT: 29 s (ref 24–36)

## 2024-06-16 LAB — PROTIME-INR
INR: 1 (ref 0.8–1.2)
Prothrombin Time: 14 s (ref 11.4–15.2)

## 2024-06-16 MED ORDER — METOCLOPRAMIDE HCL 5 MG/ML IJ SOLN
5.0000 mg | Freq: Once | INTRAMUSCULAR | Status: AC
  Administered 2024-06-16: 5 mg via INTRAVENOUS
  Filled 2024-06-16: qty 2

## 2024-06-16 MED ORDER — ACETAMINOPHEN 500 MG PO TABS
1000.0000 mg | ORAL_TABLET | Freq: Once | ORAL | Status: AC
  Administered 2024-06-16: 1000 mg via ORAL
  Filled 2024-06-16: qty 2

## 2024-06-16 MED ORDER — ONDANSETRON HCL 4 MG/2ML IJ SOLN
4.0000 mg | Freq: Once | INTRAMUSCULAR | Status: AC
  Administered 2024-06-16: 4 mg via INTRAVENOUS
  Filled 2024-06-16: qty 2

## 2024-06-16 NOTE — ED Provider Triage Note (Signed)
 Emergency Medicine Provider Triage Evaluation Note  Madison Doyle , a 88 y.o. female  was evaluated in triage.  Pt complains of headache, dizziness.  Pt presents to the ED today with weakness, dizziness, and headache.  Sx started this am.  She went to PCP and was sent here.  She denies weakness to arms/legs.  Review of Systems  Positive: Dizziness/headache Negative: Neg for arm/leg weakness  Physical Exam  BP 123/64   Pulse 85   Temp 98.4 F (36.9 C)   Resp 18   Ht 5' 6 (1.676 m)   Wt 61.2 kg   SpO2 100%   BMI 21.78 kg/m  Gen:   Awake, pt looks like she does not feel well Resp:  Normal effort  MSK:   Moves extremities without difficulty  Other:    Medical Decision Making  Medically screening exam initiated at 8:07 PM.  Appropriate orders placed.  Madison Doyle was informed that the remainder of the evaluation will be completed by another provider, this initial triage assessment does not replace that evaluation, and the importance of remaining in the ED until their evaluation is complete.  Pt is outside the window for code stroke.  Stroke orders placed for further eval.  Pt will need a room.   Dean Clarity, MD 06/16/24 2009

## 2024-06-16 NOTE — ED Triage Notes (Signed)
 Pt from home BIB EMS for headache and not being able to hear as well. Went to pcp and they cleaned her ears out. Hearing was better but now she has worsened HA, NV,  and vertigo like symptoms. No other neuro symptoms present

## 2024-06-16 NOTE — ED Provider Notes (Signed)
 [place] EMERGENCY DEPARTMENT AT [facility] Provider Note   CSN: [id number] Arrival date & time: 06/16/24  1910     Patient presents with: Dizziness and Headache   Madison Doyle is a 88 y.o. female.    Dizziness Associated symptoms: headaches   Headache Associated symptoms: dizziness      Pt had a headache this am around 9am.  She also experienced decreased hearing.  Pt went to the doctors office.  While she was there they tried to do cerumen disimpaction.  She felt a little better after the visit.  Pt took a nap for a couple of hours but then the sx got worse after she woke up..  Pt complains of the room spinning, her head is heavy and she is nauseated. Pt does have chronic  headaches but this was worse than her usual daily headache.  Patient states it is hard for her to speak.  She is not having any unilateral weakness.  She feels weak all over  Prior to Admission medications   Medication Sig Start Date End Date Taking? Authorizing Provider  albuterol  (VENTOLIN  HFA) 108 (90 Base) MCG/ACT inhaler Inhale 2 puffs into the lungs every 6 (six) hours as needed for shortness of breath or wheezing. 10/01/22   [provider]  CALCIUM PO Take 1 tablet by mouth daily.    [provider]  hydrochlorothiazide  (HYDRODIURIL ) 12.5 MG tablet Take 1 tablet (12.5 mg total) by mouth daily. 10/12/22   Gonfa, Taye T, MD  LAGEVRIO 200 MG CAPS capsule Take 4 capsules by mouth 2 (two) times daily. 09/29/22   [provider]  levothyroxine  (SYNTHROID ) 88 MCG tablet Take 88 mcg by mouth daily before breakfast.    [provider]  metoprolol  succinate (TOPROL -XL) 25 MG 24 hr tablet Take 25 mg by mouth daily. 08/02/14   [provider]  Multiple Vitamin (MULTIVITAMIN WITH MINERALS) TABS tablet Take 1 tablet by mouth daily.    [provider]  omega-3 acid ethyl esters (LOVAZA) 1 g capsule Take by mouth 2 (two) times daily.    [provider]  predniSONE  (STERAPRED UNI-PAK 21 TAB) 10 MG (21) TBPK tablet Take 10 mg by mouth See admin instructions. Take 6 tablets by mouth on day 1, then take 5 tablets by mouth on day 2, take 4 tablets by mouth on day 3, take 3 tablets by mouth on day 4, then take 2 tablets by mouth on day 5 per daughter in law.    [provider]  senna-docusate (SENOKOT-S) 8.6-50 MG tablet Take 1 tablet by mouth at bedtime as needed for moderate constipation. 10/05/22   Gonfa, Taye T, MD    Allergies: Fexofenadine hcl    Review of Systems  Neurological:  Positive for dizziness and headaches.    Updated Vital Signs BP 132/75   Pulse 63   Temp 98.4 F (36.9 C)   Resp 17   Ht 1.676 m (5' 6)   Wt 61.2 kg   SpO2 97%   BMI 21.78 kg/m   Physical Exam Vitals and nursing note reviewed.  Constitutional:      Appearance: She is well-developed. She is not ill-appearing.  HENT:     Head: Normocephalic and atraumatic.     Right Ear: External ear normal.     Left Ear: External ear normal.  Eyes:     General: No scleral icterus.       Right eye: No discharge.  Left eye: No discharge.     Conjunctiva/sclera: Conjunctivae normal.  Neck:     Trachea: No tracheal deviation.  Cardiovascular:     Rate and Rhythm: Normal rate and regular rhythm.  Pulmonary:     Effort: Pulmonary effort is normal. No respiratory distress.     Breath sounds: Normal breath sounds. No stridor. No wheezing or rales.  Abdominal:     General: Bowel sounds are normal. There is no distension.     Palpations: Abdomen is soft.     Tenderness: There is no abdominal tenderness. There is no guarding or rebound.  Musculoskeletal:        General: No tenderness or deformity.     Cervical back: Neck supple.  Skin:    General: Skin is warm and dry.     Findings: No rash.  Neurological:     Mental Status: She is alert.     Cranial Nerves: No cranial nerve deficit, dysarthria or facial asymmetry.     Sensory: No  sensory deficit.     Motor: Weakness and tremor present. No abnormal muscle tone or seizure activity.     Coordination: Coordination normal.     Comments: Patient drops both legs against the bed when she attempts to hold them against gravity.  Patient initially dropped both arms against the bed when she attempted to hold them against gravity although with some coaxing she was able to maintain for about 5 seconds.  Patient does have a tremor of the right upper extremity that per family is not new.  No sensory deficits.  No facial droop.  No visual field cut, no dysarthria    Psychiatric:        Mood and Affect: Mood is anxious.     (all labs ordered are listed, but only abnormal results are displayed) Labs Reviewed  COMPREHENSIVE METABOLIC PANEL WITH GFR - Abnormal; Notable for the following components:      Result Value   Sodium 132 (*)    CO2 21 (*)    Glucose, Bld 137 (*)    All other components within normal limits  I-STAT CHEM 8, ED - Abnormal; Notable for the following components:   Sodium 132 (*)    Glucose, Bld 141 (*)    Calcium, Ion 1.11 (*)    All other components within normal limits  ETHANOL  PROTIME-INR  APTT  CBC  DIFFERENTIAL  RAPID URINE DRUG SCREEN, HOSP PERFORMED    EKG: EKG Interpretation Date/Time:  Tuesday June 16 2024 20:37:55 EDT Ventricular Rate:  66 PR Interval:  183 QRS Duration:  104 QT Interval:  436 QTC Calculation: 457 R Axis:   71  Text Interpretation: Sinus rhythm Confirmed by Randol Simmonds [phone]) on 06/16/2024 8:51:11 PM  Radiology: MR BRAIN WO CONTRAST Result Date: 06/16/2024 CLINICAL DATA:  Neuro deficit, acute, stroke suspected EXAM: MRI HEAD WITHOUT CONTRAST TECHNIQUE: Multiplanar, multiecho pulse sequences of the brain and surrounding structures were obtained without intravenous contrast. COMPARISON:  CT head from earlier today. FINDINGS: Brain: No acute infarction, hemorrhage, hydrocephalus, extra-axial collection or mass lesion. Mild  for age T2/FLAIR hyperintensities within the white matter, compatible with mild chronic microvascular disease. Remote right basal ganglia perforator infarct. Vascular: Major arterial flow voids are maintained at the skull base. Skull and upper cervical spine: Normal marrow signal. Sinuses/Orbits: Clear sinuses.  No acute orbital findings. IMPRESSION: No evidence of acute intracranial abnormality. Electronically Signed   By: Gilmore GORMAN Molt M.D.   On: 06/16/2024 23:37  CT HEAD WO CONTRAST Result Date: 06/16/2024 CLINICAL DATA:  Neuro deficit, acute, stroke suspected EXAM: CT HEAD WITHOUT CONTRAST TECHNIQUE: Contiguous axial images were obtained from the base of the skull through the vertex without intravenous contrast. RADIATION DOSE REDUCTION: This exam was performed according to the departmental dose-optimization program which includes automated exposure control, adjustment of the mA and/or kV according to patient size and/or use of iterative reconstruction technique. COMPARISON:  MRI head 05/19/2021 FINDINGS: Brain: Patchy and confluent areas of decreased attenuation are noted throughout the deep and periventricular white matter of the cerebral hemispheres bilaterally, compatible with chronic microvascular ischemic disease. Chronic right basal ganglia lacunar infarction. No evidence of large-territorial acute infarction. No parenchymal hemorrhage. No mass lesion. No extra-axial collection. No mass effect or midline shift. No hydrocephalus. Basilar cisterns are patent. Couple subcentimeter rounded hyperdensity nodules along the pituitary gland consistent with pituitary adenoma noted on MRI head 05/19/2021. Vascular: No hyperdense vessel. Atherosclerotic calcifications are present within the cavernous internal carotid arteries. Skull: No acute fracture or focal lesion. Sinuses/Orbits: Paranasal sinuses and mastoid air cells are clear. Bilateral lens replacement. Otherwise the orbits are unremarkable. Other:  None. IMPRESSION: No acute intracranial abnormality. Electronically Signed   By: Morgane  Naveau M.D.   On: 06/16/2024 20:44     Procedures   Medications Ordered in the ED  ondansetron  (ZOFRAN ) injection 4 mg (4 mg Intravenous Given 06/16/24 2102)  acetaminophen  (TYLENOL ) tablet 1,000 mg (1,000 mg Oral Given 06/16/24 2103)  metoCLOPramide  (REGLAN ) injection 5 mg (5 mg Intravenous Given 06/16/24 2103)    Clinical Course as of 06/16/24 2340  Tue Jun 16, 2024  2050 CBC CBC normal.  No significant metabolic abnormalities. [JK]  2051 Head CT without acute abnormalities [JK]  2133 Patient states she is feeling somewhat better after the medications. [JK]  2133 However with her headache dizziness symptoms we will proceed with MRI to rule out occult stroke [JK]    Clinical Course User Index [JK] Randol Simmonds, MD                                 Medical Decision Making Amount and/or Complexity of Data Reviewed Labs:  Decision-making details documented in ED Course. Radiology: ordered.  Risk Prescription drug management.   Patient presents to the ED for evaluation of acute headache associate with dizziness.  Concerned about the possibility of cerebral hemorrhage acute stroke, complex migraine, vertigo.  Head CT did not show any acute abnormality.  Patient outside of any acute tPA window.  On exam patient initially was more globally weak.  She was treated with a migraine cocktail and symptoms have improved.  With her age and risk factors we will plan on MRI to rule out occult stroke.  Care turned over to Dr Trine at shift change.     Final diagnoses:  None    ED Discharge Orders     None          Randol Simmonds, MD 06/16/24 2340

## 2024-06-17 DIAGNOSIS — I10 Essential (primary) hypertension: Secondary | ICD-10-CM

## 2024-06-17 DIAGNOSIS — G8929 Other chronic pain: Secondary | ICD-10-CM | POA: Diagnosis present

## 2024-06-17 DIAGNOSIS — E039 Hypothyroidism, unspecified: Secondary | ICD-10-CM | POA: Diagnosis present

## 2024-06-17 DIAGNOSIS — R42 Dizziness and giddiness: Secondary | ICD-10-CM | POA: Diagnosis not present

## 2024-06-17 DIAGNOSIS — G96810 Intracranial hypotension, unspecified: Secondary | ICD-10-CM | POA: Diagnosis not present

## 2024-06-17 DIAGNOSIS — H6120 Impacted cerumen, unspecified ear: Secondary | ICD-10-CM | POA: Diagnosis present

## 2024-06-17 DIAGNOSIS — H6121 Impacted cerumen, right ear: Secondary | ICD-10-CM | POA: Diagnosis not present

## 2024-06-17 DIAGNOSIS — R519 Headache, unspecified: Secondary | ICD-10-CM | POA: Diagnosis not present

## 2024-06-17 LAB — URINALYSIS, W/ REFLEX TO CULTURE (INFECTION SUSPECTED)
Bacteria, UA: NONE SEEN
Bilirubin Urine: NEGATIVE
Glucose, UA: NEGATIVE mg/dL
Hgb urine dipstick: NEGATIVE
Ketones, ur: 20 mg/dL — AB
Leukocytes,Ua: NEGATIVE
Nitrite: NEGATIVE
Protein, ur: 30 mg/dL — AB
Specific Gravity, Urine: 1.013 (ref 1.005–1.030)
pH: 6 (ref 5.0–8.0)

## 2024-06-17 LAB — RAPID URINE DRUG SCREEN, HOSP PERFORMED
Amphetamines: NOT DETECTED
Barbiturates: NOT DETECTED
Benzodiazepines: NOT DETECTED
Cocaine: NOT DETECTED
Opiates: NOT DETECTED
Tetrahydrocannabinol: NOT DETECTED

## 2024-06-17 LAB — BASIC METABOLIC PANEL WITH GFR
Anion gap: 8 (ref 5–15)
BUN: 10 mg/dL (ref 8–23)
CO2: 23 mmol/L (ref 22–32)
Calcium: 8.7 mg/dL — ABNORMAL LOW (ref 8.9–10.3)
Chloride: 105 mmol/L (ref 98–111)
Creatinine, Ser: 0.81 mg/dL (ref 0.44–1.00)
GFR, Estimated: >60 mL/min (ref >60)
Glucose, Bld: 101 mg/dL — ABNORMAL HIGH (ref 70–99)
Potassium: 4.2 mmol/L (ref 3.5–5.1)
Sodium: 136 mmol/L (ref 135–145)

## 2024-06-17 LAB — TSH: TSH: 8.492 u[IU]/mL — ABNORMAL HIGH (ref 0.350–4.500)

## 2024-06-17 MED ORDER — ORAL CARE MOUTH RINSE: 15.0000 mL | OROMUCOSAL | Status: DC | PRN

## 2024-06-17 MED ORDER — SODIUM CHLORIDE 0.9% FLUSH
3.0000 mL | Freq: Two times a day (BID) | INTRAVENOUS | Status: DC
  Administered 2024-06-18: 3 mL via INTRAVENOUS

## 2024-06-17 MED ORDER — ALBUTEROL SULFATE (2.5 MG/3ML) 0.083% IN NEBU: 2.5000 mg | INHALATION_SOLUTION | Freq: Four times a day (QID) | RESPIRATORY_TRACT | Status: DC | PRN

## 2024-06-17 MED ORDER — LEVOTHYROXINE SODIUM 88 MCG PO TABS
88.0000 ug | ORAL_TABLET | Freq: Every day | ORAL | Status: DC
  Administered 2024-06-17 – 2024-06-18 (×2): 88 ug via ORAL
  Filled 2024-06-17 (×2): qty 1

## 2024-06-17 MED ORDER — ONDANSETRON HCL 4 MG PO TABS: 4.0000 mg | ORAL_TABLET | Freq: Four times a day (QID) | ORAL | Status: DC | PRN

## 2024-06-17 MED ORDER — ACETAMINOPHEN 325 MG PO TABS
650.0000 mg | ORAL_TABLET | Freq: Four times a day (QID) | ORAL | Status: DC | PRN
  Administered 2024-06-18: 650 mg via ORAL
  Filled 2024-06-17: qty 2

## 2024-06-17 MED ORDER — METOPROLOL SUCCINATE ER 25 MG PO TB24
25.0000 mg | ORAL_TABLET | Freq: Every day | ORAL | Status: DC
Start: 2024-06-17 — End: 2024-06-18
  Administered 2024-06-17 – 2024-06-18 (×2): 25 mg via ORAL
  Filled 2024-06-17 (×2): qty 1

## 2024-06-17 MED ORDER — LOSARTAN POTASSIUM 25 MG PO TABS
12.5000 mg | ORAL_TABLET | Freq: Every day | ORAL | Status: DC
  Administered 2024-06-17 – 2024-06-18 (×2): 12.5 mg via ORAL
  Filled 2024-06-17: qty 1
  Filled 2024-06-17 (×2): qty 0.5

## 2024-06-17 MED ORDER — ACETAMINOPHEN 650 MG RE SUPP: 650.0000 mg | Freq: Four times a day (QID) | RECTAL | Status: DC | PRN

## 2024-06-17 MED ORDER — METOCLOPRAMIDE HCL 5 MG/ML IJ SOLN
5.0000 mg | Freq: Once | INTRAMUSCULAR | Status: AC
  Administered 2024-06-17: 5 mg via INTRAVENOUS
  Filled 2024-06-17: qty 2

## 2024-06-17 MED ORDER — DOCUSATE SODIUM 50 MG/5ML PO LIQD
1.0000 mL | Freq: Once | ORAL | Status: AC
  Administered 2024-06-17: 20 mg via OTIC
  Filled 2024-06-17: qty 10

## 2024-06-17 MED ORDER — ONDANSETRON HCL 4 MG/2ML IJ SOLN: 4.0000 mg | Freq: Four times a day (QID) | INTRAMUSCULAR | Status: DC | PRN

## 2024-06-17 MED ORDER — SODIUM CHLORIDE 0.9 % IV BOLUS
1000.0000 mL | Freq: Once | INTRAVENOUS | Status: AC
  Administered 2024-06-17: 1000 mL via INTRAVENOUS

## 2024-06-17 MED ORDER — ENOXAPARIN SODIUM 40 MG/0.4ML IJ SOSY
40.0000 mg | PREFILLED_SYRINGE | INTRAMUSCULAR | Status: DC
  Administered 2024-06-17 – 2024-06-18 (×2): 40 mg via SUBCUTANEOUS
  Filled 2024-06-17 (×2): qty 0.4

## 2024-06-17 NOTE — Progress Notes (Signed)
 Interventional Radiology Brief Note:  Patient with history of CSF-venous fistula with intracranial hypotension.  She is a well-established patient at the [facility] CSF clinic now here for dizziness.   IR was consulted for possible blood patch for this patient. Case was reviewed by Dr. Johann who agrees her case is certainly complicated and without an established area of leak/target for patch (if this is the reason for her symptoms) a blood patch may not be successful.  As she is well-established with neurosurgery, NIR, and spine services at [facility], best plan would be to direct to her care team.   Ordering teams made aware.   Demari Gales, MS RD PA-C

## 2024-06-17 NOTE — H&P (Signed)
 History and Physical    Patient: Madison Doyle FMW:[id number] DOB: [birth year] DOA: 06/16/2024 DOS: the patient was seen and examined on 06/17/2024 PCP: Loreli Elsie [name], MD  Patient coming from: Home  Chief Complaint:  Chief Complaint  Patient presents with   Dizziness   Headache   HPI: Madison Doyle is a 88 y.o. 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 She describes difficulty in organizing her thoughts and words.  She has a history of low intracranial pressure due to spinal fluid leakage, for which she has undergone prior blood patch and an embolization in June. Her usual headaches are low grade rated 3 out of 10 , but yesterday morning, she experienced a severe headache rated at 7 out of 10 at the base of her skull, accompanied by a sudden change in hearing, with sounds being muffled and distorted.  Following an attempt to irrigate her ears by her primary care doctor, she took a nap and awoke feeling nauseous, with the room spinning, a cold sensation in her head, and heavy eyes. She repeatedly expressed feeling very sick. Medication, including Zofran , provided relief.  Irrigation attempts for earwax have been unsuccessful.  A bruise or mark was noted on her chest after an MRI, which does not itch or hurt. It was initially thought to be a reaction to electrode glue, but it appears more like bruising.  In the emergency department patient was noted to be afebrile with blood pressure 116/61 155/80, and all other vital signs maintained.  Labs significant for sodium 132 and glucose 137.  CT scan of the head with and subsequent MRI of the brain did not reveal any acute abnormality.  Urinalysis noted ketones in 30  protein, but did not reveal any significant signs for infection.  UDS negative.  Review of Systems: As mentioned in the history of present illness. All other systems reviewed and are negative. No past medical history on file. Past Surgical History:  Procedure Laterality Date   PARTIAL HIP ARTHROPLASTY     Social History:  reports that she has never smoked. She has never used smokeless tobacco. No history on file for alcohol  use and drug use.  Allergies  Allergen Reactions   Fexofenadine Hcl Hives   Latex     redness    No family history on file.  Prior to Admission medications   Medication Sig Start Date End Date Taking? Authorizing Provider  alendronate (FOSAMAX) 70 MG tablet Take 70 mg by mouth every Monday.   Yes [provider]  CALCIUM PO Take 600 mg by mouth daily.   Yes [provider]  famotidine (PEPCID) 40 MG tablet Take 40 mg by mouth daily. 06/07/20  Yes [provider]  levothyroxine  (SYNTHROID ) 88 MCG tablet Take 88 mcg by mouth daily before breakfast.   Yes [provider]  losartan  (COZAAR ) 25 MG tablet Take 12.5 mg by mouth daily. 06/06/23  Yes [provider]  metoprolol  succinate (TOPROL -XL) 25 MG 24 hr tablet Take 25 mg by mouth daily. 08/02/14  Yes [provider]  Multiple Vitamin (MULTIVITAMIN WITH MINERALS) TABS tablet Take 1 tablet by mouth daily.   Yes [provider]    Physical Exam: Vitals:   06/17/24 0430 06/17/24 0530 06/17/24 0600  06/17/24 0717  BP: 116/61 138/76 (!) 147/83   Pulse: 70 83 83   Resp:   14   Temp:    97.8 F (36.6 C)  TempSrc:    Oral  SpO2: 100% 100% 100%   Weight:      Height:         Constitutional: Elderly female currently in no acute distress Eyes: PERRL, lids and conjunctivae normal ENMT: Mucous membranes are moist.  Cerumen impaction of the right ear.  Some mild fluid behind the tympanic membrane on the right ear. Neck: normal, supple,  Respiratory: clear  to auscultation bilaterally, no wheezing, no crackles. Normal respiratory effort. No accessory muscle use.  Cardiovascular: Regular rate and rhythm, no murmurs / rubs / gallops. No extremity edema. 2+ pedal pulses. No carotid bruits.  Abdomen: no tenderness, no masses palpated.  . Bowel sounds positive.  Musculoskeletal: no clubbing / cyanosis. No joint deformity upper and lower extremities. Good ROM, no contractures. Normal muscle tone.  Skin: no rashes, lesions, ulcers. No induration Neurologic: CN 2-12 grossly intact. Sensation intact, DTR normal. Strength 5/5 in all 4. Does have some difficulty in getting her words out at times. Psychiatric:  Alert and oriented x person and place.    Normal mood.   Data Reviewed:  EKG revealed normal sinus rhythm at 66 bpm without any acute abnormality.  Reviewed labs, imaging, and pertinent records as documented.  Assessment and Plan:  Dizziness Chronic headaches  Patient presents with history of chronic intracranial hypotension and headaches presents with increasing headache and dizziness.  Discussed the possibility of blood patch, but IR felt due to patient's complexity of patient's case it was recommended for her to followed up at [facility] CSF clinic.  Dr. Voncile had contacted [facility] who will call and schedule the patient for follow-up appointment. - Admit to medical telemetry bed - Check orthostatic vital signs - PT to evaluate and treat - Vestibular PT  - Neurology consulted, we will follow-up for any further recommendations.  Cerumen impaction of the right ear Patient's right ear was able to be irrigated while in the ED, but likely needs to follow-up in the outpatient setting with ENT.  Essential hypertension - Losartan  and metoprolol  as tolerated  Hypothyroidism - Check TSH - Continue levothyroxine   DVT prophylaxis: Lovenox   Advance Care Planning:   Code Status: Full Code    Consults: Neurology, IR  Family Communication: family  updated at bedside  Severity of Illness: The appropriate patient status for this patient is OBSERVATION. Observation status is judged to be reasonable and necessary in order to provide the required intensity of service to ensure the patient's safety. The patient's presenting symptoms, physical exam findings, and initial radiographic and laboratory data in the context of their medical condition is felt to place them at decreased risk for further clinical deterioration. Furthermore, it is anticipated that the patient will be medically stable for discharge from the hospital within 2 midnights of admission.   Author: Maximino DELENA Sharps, MD 06/17/2024 8:02 AM  For on call review [email or web address].

## 2024-06-17 NOTE — ED Provider Notes (Signed)
 I assumed care of this patient from previous provider.  Please see their note for further details of history, exam, and MDM.   Briefly patient is a 88 y.o. female who presented with headache and dizziness. Workup thus far has been reassuring without significant electrolyte derangements.  CT head was negative.  Currently awaiting MRI to rule out stroke.  MRI will negative for any acute strokes.  It does note a remote right basal ganglia stroke which on my evaluation appears to have been there on an MRI from 2022.  On further investigation, patient has a history of intracranial hypotension related to CSF venous fistula requiring blood patches.  Patient was ambulated and continues to be symptomatic requiring two-person assist.  I have a concern that she has an exacerbation of her intracranial hypotension and will require admission for further management.  IV fluids initiated.   Interventional radiology evaluation and management order placed. Consulted hospitalist service and spoke with Dr. Sundil who will arrange to have patient admitted.  She requested we consult neurology for their recommendations as well. Spoke with Dr. Michaela from Neurology who will consult on patient.      Madison Raynell Moder, MD 06/17/24 [id number]

## 2024-06-17 NOTE — ED Notes (Addendum)
 Dr Claudene aware of allergic reaction to electrodes and gave the OK for no tele monitoring at this time.

## 2024-06-17 NOTE — Progress Notes (Signed)
 SAME DAY NOTE Patient seen earlier on the day on rounds. Says she is feeling somewhat better. The plan was to get a blood patch but also to discuss the case with [facility] leak clinic. I was fortunately able to get in touch with her doctor at the [facility] Dr. Albertus.  We discussed her case and current imaging in detail. He recommends that she be seen in clinic for a possible myelogram and further course of treatment.  His office will call to schedule. We discussed blood patch.  He said that there might be some temporary benefit but there might be more side effects and pain related with that which might make the patient's current condition more worse. I would recommend therapy evaluations and outpatient follow-up with the [facility], who will call to schedule the appointment. This was discussed with the admitting Dr. Claudene, and later on with the patient in person. Please call neurology with questions as needed   Eligio Lav, MD Neurology

## 2024-06-17 NOTE — Hospital Course (Addendum)
 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  She also experienced decreased hearing.  Pt went to the doctors office.  While she was there they tried to do cerumen disimpaction.  She felt a little better after the visit.  Pt took a nap for a couple of hours but then the sx got worse after she woke up..  Pt complains of the room spinning, her head is heavy and she is nauseated. Pt does have chronic  headaches but this was worse than her usual daily headache.  Patient states it is hard for her to speak.  She is not having any unilateral weakness.  She feels weak all over.\  Patient is hemodynamically stable.  CBC unremarkable.  CMP showing low sodium 132 which is around baseline, low bicarb 21.  Otherwise unremarkable.  UA and UDS unremarkable.  MRI of the brain no acute intracranial abnormality. CT head no acute intracranial abnormality.  Dr. Trine is concerned about patient has chronic intracranial hypotension which is causing the headache and dizziness.  Due to the history of CSF venous fistula or CSF leak patient required CSF fistula embolization and blood patches by IR in outpatient settings multiple times.  Dr. Trine consulted IR regarding this.  Requested Dr. Trine to consult neurology for further evaluation here.  Hospitalist been consulted for management of intracranial hypertension.

## 2024-06-17 NOTE — ED Notes (Signed)
 Pt was seen by her PCP to have her right ear flushed. It was noted she had some impacted wax. PCP was unsuccessful with clearing wax and recommended ENT. Daughter noted after pt came home she started to have symptoms of feeling heavy and things proceeded to hearing loss and dizziness.

## 2024-06-17 NOTE — Consult Note (Addendum)
 NEUROLOGY CONSULT NOTE   Date of service: June 17, 2024 Patient Name: Madison Doyle MRN:  [id number] DOB:  April 17, 1936 Chief Complaint: Headache Requesting Provider: Trine Raynell Moder, *  History of Present Illness  Madison Doyle is a 88 y.o. female with hx of migraines since age 68 as well as CSF hypotension suspected to have started in 2019.  MRI at that time demonstrated smooth dural enhancement.  She was found to have a CSF venous fistula at T9-10.  She underwent epidural fibrin glue and blood patch.  She had incomplete improvement and underwent a T9 ligation of the CSF venous fistula.  In 2022, she had a sudden recurrence of headache and thoracic pain.  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  She underwent a transvenous embolization of this in January 2024.  In May of this year, another CVF was identified at T11-12 and this was embolized in June.    She is slightly confused, which makes doing history regarding the events of yesterday much more difficult.  Her daughter reports that she has been confused with hospitalizations in the past after her embolizations as well.  In any case, it is clear that she woke up yesterday complaining of increasing dizziness and difficulty hearing.  They went to her PCP who discovered she had a severe cerumen impaction of the right ear and try to clear it out.  They are unsuccessful and referred her to ENT.  She then went home and took a nap and on awakening from the nap, her headache which was occipital in location (typical for her low pressure headaches) was much worse, she had difficulty hearing, and she was dizzy and did not feel right.  Due to the symptoms, her daughter called 911 and she was brought into the emergency department.  In the ER, she was evaluated with MRI which was negative.  She currently reports that her symptoms are much improved  from yesterday, but again seems her history is relatively unreliable as well.    Past History  No past medical history on file.  Past Surgical History:  Procedure Laterality Date   PARTIAL HIP ARTHROPLASTY      Family History: No family history on file.  Social History  reports that she has never smoked. She has never used smokeless tobacco. No history on file for alcohol  use and drug use.  Allergies  Allergen Reactions   Fexofenadine Hcl Hives   Latex     redness    Medications  No current facility-administered medications for this encounter.  Current Outpatient Medications:    CALCIUM PO, Take 600 mg by mouth daily., Disp: , Rfl:    famotidine (PEPCID) 40 MG tablet, Take 40 mg by mouth daily., Disp: , Rfl:    levothyroxine  (SYNTHROID ) 88 MCG tablet, Take 88 mcg by mouth daily before breakfast., Disp: , Rfl:    losartan  (COZAAR ) 25 MG tablet, Take 12.5 mg by mouth daily., Disp: , Rfl:    metoprolol  succinate (TOPROL -XL) 25 MG 24 hr tablet, Take 25 mg by mouth daily., Disp: , Rfl:    Multiple Vitamin (MULTIVITAMIN WITH MINERALS) TABS tablet, Take 1 tablet by mouth daily., Disp: , Rfl:   Vitals   Vitals:   06/16/24 2215 06/16/24 2230 06/16/24 2245 06/17/24 0211  BP: (!) 141/76 (!) 141/74 132/75 (!) 121/93  Pulse: 66 65 63 84  Resp: 16 14 17  16  Temp:    97.8 F (36.6 C)  TempSrc:    Oral  SpO2: 99% 99% 97% 97%  Weight:      Height:        Body mass index is 21.78 kg/m.   Physical Exam   Constitutional: Appears well-developed and well-nourished.   Neurologic Examination    Neuro: Mental Status: Patient is awake, alert, oriented to person, year, she initially gives the month is June and location as [place] before correcting herself to [employer] in [place]. No signs of aphasia or neglect Cranial Nerves: II: Visual Fields are full. Pupils are equal, round, and reactive to light.   III,IV, VI: EOMI without ptosis or diploplia.  V: Facial sensation is  symmetric to temperature VII: Facial movement is symmetric.  VIII: hearing is intact to voice X: Uvula elevates symmetrically XII: tongue is midline without atrophy or fasciculations.  Motor: Tone is normal. Bulk is normal. 5/5 strength was present in all four extremities.  Sensory: Sensation is symmetric to light touch and temperature in the arms and legs. Cerebellar: She has significant postural tremor bilaterally        Labs/Imaging/Neurodiagnostic studies   CBC:  Recent Labs  Lab Jun 29, 2024 2005 June 29, 2024 2009  WBC 5.0  --   NEUTROABS 3.8  --   HGB 13.6 13.9  HCT 40.1 41.0  MCV 89.5  --   PLT 230  --    Basic Metabolic Panel:  Lab Results  Component Value Date   NA 132 (L) Jun 29, 2024   K 4.6 2024/06/29   CO2 21 (L) 06-29-24   GLUCOSE 141 (H) 06-29-2024   BUN 16 06-29-24   CREATININE 0.70 06-29-24   CALCIUM 9.2 06-29-2024   GFRNONAA >60 06-29-2024   Lipid Panel: No results found for: LDLCALC HgbA1c: No results found for: HGBA1C Urine Drug Screen:     Component Value Date/Time   LABOPIA NONE DETECTED 06/17/2024 0202   COCAINSCRNUR NONE DETECTED 06/17/2024 0202   LABBENZ NONE DETECTED 06/17/2024 0202   AMPHETMU NONE DETECTED 06/17/2024 0202   THCU NONE DETECTED 06/17/2024 0202   LABBARB NONE DETECTED 06/17/2024 0202    Alcohol  Level     Component Value Date/Time   ETH <15 29-Jun-2024 2005   INR  Lab Results  Component Value Date   INR 1.0 06-29-24   APTT  Lab Results  Component Value Date   APTT 29 06/29/24    MRI Brain(Personally reviewed): Negative for acute findings  ASSESSMENT   Madison Doyle is a 88 y.o. female with daily orthostatic headaches who presents with an abrupt worsening coupled with hearing change and severe dizziness.  Given that she thinks that the headache was very similar to her previous low pressure headaches, I do think that intracranial hypotension is likely etiology, but with a cerumen impaction, it is  difficult to exclude this as a culprit as well.  Fluctuating hearing loss can certainly be a symptom of intracranial hypotension.  She does have a history of becoming mildly confused post procedurally and I suspect that her current confusion represents delirium from being up all night in the emergency department.  There is no evidence of UTI on UA, no evidence of other infection.  RECOMMENDATIONS  Would attempt again to clear out cerumen impaction If no improvement, may need to consider epidural blood patch, would target the thoracic region given this has been where her previous issues have been I think it may be prudent to discuss her case with the CSF  clinic at [facility] given that she is a patient there. ______________________________________________________________________    Signed, Aisha Seals, MD [facility]

## 2024-06-17 NOTE — Plan of Care (Signed)
   Problem: Education: Goal: Knowledge of General Education information will improve Description Including pain rating scale, medication(s)/side effects and non-pharmacologic comfort measures Outcome: Progressing

## 2024-06-17 NOTE — ED Notes (Signed)
 RN flushed right ear with 240 mL of NS and peroxide mixed. No ear wax was noted to be dislodged out of right ear. Dark wax still appears to be lodged.  RN flushed left ear with 80 mL of NS and peroxide mixed. No wax noted in ear.

## 2024-06-17 NOTE — ED Notes (Signed)
 Pt returned from MRI and upon putting pt back on the monitor, her family noticed reddened areas on her skin. The red spots were in the areas where the EKG leads were prior to them being removed by MRI. Cardama MD at bedside and aware. Marked pts skin. Continue to monitor for worsening reaction per Cardama MD.

## 2024-06-17 NOTE — Evaluation (Signed)
 Physical Therapy Evaluation Patient Details Name: Madison Doyle MRN: [id number] DOB: 13-Jan-1936 Today's Date: 06/17/2024  History of Present Illness  Patient is an 88 y/o female admitted 06/16/24 due to dizziness, difficulty hearing with severe cerumen impaction of R ear, and occipital headache.  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  Clinical Impression  Patient presents with decreased mobility due to decreased balance, decreased strength and some dizziness.  Previously independent with rollator at home with help for heavy cleaning and transportation.  She reports dizziness similar to when she has had CSF leak in the past though improved since clearing out ear impaction.  She was able to walk in hallway with S to CGA and able to stand without UE support intermittently to brush teeth at the sink.  Should be able to d/c home with intermittent family support and HHPT.  Will follow up, however, to ensure able to negotiate stairs and that dizziness continued to improve.         If plan is discharge home, recommend the following: A little help with walking and/or transfers;Help with stairs or ramp for entrance   Can travel by private vehicle        Equipment Recommendations None recommended by PT  Recommendations for Other Services       Functional Status Assessment Patient has had a recent decline in their functional status and demonstrates the ability to make significant improvements in function in a reasonable and predictable amount of time.     Precautions / Restrictions Precautions Precautions: Fall      Mobility  Bed Mobility               General bed mobility comments: seated EOB    Transfers Overall transfer level: Needs assistance Equipment used: Rolling walker (2 wheels) Transfers: Sit to/from Stand Sit to Stand: Contact guard assist, Min assist            General transfer comment: struggled to stand from EOB with couple of attempts CGA for anterior weight shift and pt getting legs under her; immediately fell back on bed after standing and knees giving away, repeat sit to stand without LOB with min A    Ambulation/Gait Ambulation/Gait assistance: Supervision, Contact guard assist Gait Distance (Feet): 220 Feet Assistive device: Rolling walker (2 wheels) Gait Pattern/deviations: Step-through pattern, Decreased stride length, Shuffle       General Gait Details: slow pace, increased time with turns (used to rollator) denies dizziness, eager to walk  Stairs            Wheelchair Mobility     Tilt Bed    Modified Rankin (Stroke Patients Only)       Balance Overall balance assessment: Needs assistance   Sitting balance-Leahy Scale: Good     Standing balance support: During functional activity Standing balance-Leahy Scale: Fair Standing balance comment: standing to brush teeth at sink, can stand briefly without UE support then reaches to hold on with L hand on door post, when trying to place tooth brush back in container was going to sit on rollator till realized she didn't have her rollator, caught herself and states in the habit of using it at home                             Pertinent Vitals/Pain Pain Assessment Pain Assessment: Faces Faces Pain Scale: Hurts little  more Pain Location: headache (now up on top on R side, was occipital) Pain Descriptors / Indicators: Headache Pain Intervention(s): Monitored during session    Home Living Family/patient expects to be discharged to:: Private residence Living Arrangements: Alone Available Help at Discharge: Family;Available PRN/intermittently Type of Home: House (townhome) Home Access: Stairs to enter Entrance Stairs-Rails: Right Entrance Stairs-Number of Steps: 2 Alternate Level Stairs-Number of Steps: flight Home Layout: Two level;Able to live on main  level with bedroom/bathroom Home Equipment: Rollator (4 wheels)      Prior Function Prior Level of Function : Independent/Modified Independent             Mobility Comments: son and daughter in law helping with transportation ADLs Comments: has cleaning help     Extremity/Trunk Assessment   Upper Extremity Assessment Upper Extremity Assessment: Overall WFL for tasks assessed    Lower Extremity Assessment Lower Extremity Assessment: Generalized weakness    Cervical / Trunk Assessment Cervical / Trunk Assessment: Normal  Communication   Communication Communication: Impaired Factors Affecting Communication: Hearing impaired (not wearing hearing aides due to R ear impaction)    Cognition Arousal: Alert Behavior During Therapy: WFL for tasks assessed/performed   PT - Cognitive impairments: No apparent impairments                       PT - Cognition Comments: not specifically tested Following commands: Intact       Cueing       General Comments General comments (skin integrity, edema, etc.): Discussed dizziness and stated felt similar to prior CSF leaks though feels better since clearing some of the wax out of her ear.  Hoping it is not another leak.  Educated can have inner ear inflammation or infection that can cause dizziness, though reports it feels better now.    Exercises     Assessment/Plan    PT Assessment Patient needs continued PT services  PT Problem List Decreased mobility;Decreased strength;Decreased safety awareness       PT Treatment Interventions DME instruction;Gait training;Stair training;Functional mobility training;Therapeutic activities;Therapeutic exercise;Balance training    PT Goals (Current goals can be found in the Care Plan section)  Acute Rehab PT Goals Patient Stated Goal: return to independent PT Goal Formulation: With patient Time For Goal Achievement: 07/01/24 Potential to Achieve Goals: Good    Frequency Min  3X/week     Co-evaluation               AM-PAC PT 6 Clicks Mobility  Outcome Measure Help needed turning from your back to your side while in a flat bed without using bedrails?: None Help needed moving from lying on your back to sitting on the side of a flat bed without using bedrails?: None Help needed moving to and from a bed to a chair (including a wheelchair)?: A Little Help needed standing up from a chair using your arms (e.g., wheelchair or bedside chair)?: A Little Help needed to walk in hospital room?: A Little Help needed climbing 3-5 steps with a railing? : A Lot 6 Click Score: 19    End of Session Equipment Utilized During Treatment: Gait belt Activity Tolerance: Patient tolerated treatment well Patient left: in chair;with call bell/phone within reach   PT Visit Diagnosis: Other abnormalities of gait and mobility (R26.89);Muscle weakness (generalized) (M62.81)    Time: 1750-1820 PT Time Calculation (min) (ACUTE ONLY): 30 min   Charges:   PT Evaluation $PT Eval Moderate Complexity: 1 Mod PT Treatments $  Gait Training: 8-22 mins PT General Charges $$ ACUTE PT VISIT: 1 Visit         Micheline Portal, PT Acute Rehabilitation Services Office:[phone] 06/17/2024   [name] 06/17/2024, 7:07 PM

## 2024-06-17 NOTE — H&P (Incomplete Revision)
 History and Physical    Patient: Madison Doyle FMW:[id number] DOB: [birth year] DOA: 06/16/2024 DOS: the patient was seen and examined on 06/17/2024 PCP: Loreli Elsie [name], MD  Patient coming from: Home  Chief Complaint:  Chief Complaint  Patient presents with   Dizziness   Headache   HPI: ARIONNA HOGGARD is a 88 y.o. 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 She describes difficulty in organizing her thoughts and words.  She has a history of low intracranial pressure due to spinal fluid leakage, for which she has undergone prior blood patch and an embolization in June. Her usual headaches are low grade rated 3 out of 10 , but yesterday morning, she experienced a severe headache rated at 7 out of 10 at the base of her skull, accompanied by a sudden change in hearing, with sounds being muffled and distorted.  Following an attempt to irrigate her ears by her primary care doctor, she took a nap and awoke feeling nauseous, with the room spinning, a cold sensation in her head, and heavy eyes. She repeatedly expressed feeling very sick. Medication, including Zofran , provided relief.  Irrigation attempts for earwax have been unsuccessful.  A bruise or mark was noted on her chest after an MRI, which does not itch or hurt. It was initially thought to be a reaction to electrode glue, but it appears more like bruising.  In the emergency department patient was noted to be afebrile with blood pressure 116/61 155/80, and all other vital signs maintained.  Labs significant for sodium 132 and glucose 137.  CT scan of the head with and subsequent MRI of the brain did not reveal any acute abnormality.  Urinalysis noted ketones in 30  protein, but did not reveal any significant signs for infection.  UDS negative.  Review of Systems: As mentioned in the history of present illness. All other systems reviewed and are negative. No past medical history on file. Past Surgical History:  Procedure Laterality Date   PARTIAL HIP ARTHROPLASTY     Social History:  reports that she has never smoked. She has never used smokeless tobacco. No history on file for alcohol  use and drug use.  Allergies  Allergen Reactions   Fexofenadine Hcl Hives   Latex     redness    No family history on file.  Prior to Admission medications   Medication Sig Start Date End Date Taking? Authorizing Provider  alendronate (FOSAMAX) 70 MG tablet Take 70 mg by mouth every Monday.   Yes [provider]  CALCIUM PO Take 600 mg by mouth daily.   Yes [provider]  famotidine (PEPCID) 40 MG tablet Take 40 mg by mouth daily. 06/07/20  Yes [provider]  levothyroxine  (SYNTHROID ) 88 MCG tablet Take 88 mcg by mouth daily before breakfast.   Yes [provider]  losartan  (COZAAR ) 25 MG tablet Take 12.5 mg by mouth daily. 06/06/23  Yes [provider]  metoprolol  succinate (TOPROL -XL) 25 MG 24 hr tablet Take 25 mg by mouth daily. 08/02/14  Yes [provider]  Multiple Vitamin (MULTIVITAMIN WITH MINERALS) TABS tablet Take 1 tablet by mouth daily.   Yes [provider]    Physical Exam: Vitals:   06/17/24 0430 06/17/24 0530 06/17/24 0600  06/17/24 0717  BP: 116/61 138/76 (!) 147/83   Pulse: 70 83 83   Resp:   14   Temp:    97.8 F (36.6 C)  TempSrc:    Oral  SpO2: 100% 100% 100%   Weight:      Height:         Constitutional: Elderly female currently in no acute distress Eyes: PERRL, lids and conjunctivae normal ENMT: Mucous membranes are moist.  Cerumen impaction of the right ear.  Some mild fluid behind the tympanic membrane on the right ear. Neck: normal, supple,  Respiratory: clear  to auscultation bilaterally, no wheezing, no crackles. Normal respiratory effort. No accessory muscle use.  Cardiovascular: Regular rate and rhythm, no murmurs / rubs / gallops. No extremity edema. 2+ pedal pulses. No carotid bruits.  Abdomen: no tenderness, no masses palpated.  . Bowel sounds positive.  Musculoskeletal: no clubbing / cyanosis. No joint deformity upper and lower extremities. Good ROM, no contractures. Normal muscle tone.  Skin: no rashes, lesions, ulcers. No induration Neurologic: CN 2-12 grossly intact. Sensation intact, DTR normal. Strength 5/5 in all 4. Does have some difficulty in getting her words out at times. Psychiatric:  Alert and oriented x person and place.    Normal mood.   Data Reviewed:  EKG revealed normal sinus rhythm at 66 bpm without any acute abnormality.  Reviewed labs, imaging, and pertinent records as documented.  Assessment and Plan:  Dizziness Chronic headaches  Patient presents with history of chronic intracranial hypotension and headaches presents with increasing headache and dizziness.  Discussed the possibility of blood patch, but IR felt due to patient's complexity of patient's case it was recommended for her to followed up at [facility] CSF clinic.  Dr. Voncile had contacted [facility] who will call and schedule the patient for follow-up appointment. - Admit to medical telemetry bed - Check orthostatic vital signs - PT to evaluate and treat - Vestibular PT  - Neurology consulted, we will follow-up for any further recommendations.  Cerumen impaction of the right ear Patient's right ear was able to be irrigated while in the ED, but likely needs to follow-up in the outpatient setting with ENT.  Essential hypertension - Losartan  and metoprolol  as tolerated  Hypothyroidism - Check TSH - Continue levothyroxine   DVT prophylaxis: Lovenox   Advance Care Planning:   Code Status: Full Code    Consults: Neurology, IR  Family Communication: family  updated at bedside  Severity of Illness: The appropriate patient status for this patient is OBSERVATION. Observation status is judged to be reasonable and necessary in order to provide the required intensity of service to ensure the patient's safety. The patient's presenting symptoms, physical exam findings, and initial radiographic and laboratory data in the context of their medical condition is felt to place them at decreased risk for further clinical deterioration. Furthermore, it is anticipated that the patient will be medically stable for discharge from the hospital within 2 midnights of admission.   Author: Maximino DELENA Sharps, MD 06/17/2024 8:02 AM  For on call review [email or web address].

## 2024-06-17 NOTE — ED Notes (Signed)
 RN provided pt with two warm blankets

## 2024-06-18 DIAGNOSIS — H6091 Unspecified otitis externa, right ear: Secondary | ICD-10-CM

## 2024-06-18 DIAGNOSIS — H6121 Impacted cerumen, right ear: Secondary | ICD-10-CM | POA: Diagnosis not present

## 2024-06-18 DIAGNOSIS — R42 Dizziness and giddiness: Secondary | ICD-10-CM | POA: Diagnosis not present

## 2024-06-18 LAB — CBC
HCT: 37.1 % (ref 36.0–46.0)
Hemoglobin: 12.5 g/dL (ref 12.0–15.0)
MCH: 30 pg (ref 26.0–34.0)
MCHC: 33.7 g/dL (ref 30.0–36.0)
MCV: 89.2 fL (ref 80.0–100.0)
Platelets: 208 K/uL (ref 150–400)
RBC: 4.16 MIL/uL (ref 3.87–5.11)
RDW: 13 % (ref 11.5–15.5)
WBC: 4.9 K/uL (ref 4.0–10.5)
nRBC: 0 % (ref 0.0–0.2)

## 2024-06-18 LAB — BASIC METABOLIC PANEL WITH GFR
Anion gap: 9 (ref 5–15)
BUN: 13 mg/dL (ref 8–23)
CO2: 22 mmol/L (ref 22–32)
Calcium: 8.6 mg/dL — ABNORMAL LOW (ref 8.9–10.3)
Chloride: 103 mmol/L (ref 98–111)
Creatinine, Ser: 0.84 mg/dL (ref 0.44–1.00)
GFR, Estimated: >60 mL/min (ref >60)
Glucose, Bld: 95 mg/dL (ref 70–99)
Potassium: 3.8 mmol/L (ref 3.5–5.1)
Sodium: 134 mmol/L — ABNORMAL LOW (ref 135–145)

## 2024-06-18 MED ORDER — CIPROFLOXACIN-DEXAMETHASONE 0.3-0.1 % OT SUSP: 4.0000 [drp] | Freq: Two times a day (BID) | OTIC | 0 refills | Status: AC

## 2024-06-18 MED ORDER — MECLIZINE HCL 12.5 MG PO TABS: 12.5000 mg | ORAL_TABLET | Freq: Three times a day (TID) | ORAL | 0 refills | Status: AC | PRN

## 2024-06-18 MED ORDER — CIPROFLOXACIN-DEXAMETHASONE 0.3-0.1 % OT SUSP
4.0000 [drp] | Freq: Two times a day (BID) | OTIC | Status: DC
  Filled 2024-06-18: qty 0.2

## 2024-06-18 NOTE — Discharge Summary (Addendum)
 Physician Discharge Summary  Emonnie Cannady Doyle FMW:[id number] DOB: 09-10-1936 DOA: 06/16/2024  PCP: Madison Elsie [name], MD  Admit date: 06/16/2024 Discharge date: 06/18/2024  Admitted From: Home Disposition: Home  Recommendations for Outpatient Follow-up:  Follow up with PCP in 1 week with repeat CBC/BMP Recommend outpatient evaluation and follow-up by ENT Outpatient follow-up with [facility] Follow up in ED if symptoms worsen or new appear   Home Health: Home health PT Equipment/Devices: None  Discharge Condition: Stable CODE STATUS: Full Diet recommendation: Heart healthy  Brief/Interim Summary:  88 y.o. 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  On presentation, she was hemodynamically stable.  CT of the head and MRI brain did not show any acute intracranial abnormality.  Neurology recommended IR consultation for possible epidural blood patch.  IR did not think that the patch will be helpful.  Patient will need to follow-up with CSF clinic as an outpatient at [facility].  Neurology team communicated with [facility] who will arrange for outpatient follow-up at [facility].  She feels slightly better and feels okay to go home today.  Discharge patient home today with home health PT.  Discharge Diagnoses:   Dizziness/chronic headaches -On presentation, she was hemodynamically stable.  CT of the head and MRI brain did not show any acute intracranial abnormality.  Neurology recommended IR consultation for possible epidural blood patch.  IR did not think that the patch will be helpful.  Patient will need to follow-up with CSF clinic as an outpatient at [facility].  Neurology team communicated with [facility] who will  arrange for outpatient follow-up at [facility].  She feels slightly better and feels okay to go home today.  Discharge patient home today with home health PT. use meclizine  as needed.  Cerumen impaction of the right ear - Patient's right ear was irrigated while in the ED. She had some minor bleeding from the right ear overnight which has resolved. -ENT evaluated the patient inpatient and recommended Ciprodex  bid for 7 days and outpatient follow up with ENT  Essential hypertension - Continue home regimen.  Outpatient follow-up  Hypothyroidism -continue levothyroxine     Discharge Instructions  Discharge Instructions     Ambulatory referral to ENT   Complete by: As directed    Diet - low sodium heart healthy   Complete by: As directed    Increase activity slowly   Complete by: As directed       Allergies as of 06/18/2024       Reactions   Fexofenadine Hcl Hives   Latex    redness        Medication List     TAKE these medications    alendronate 70 MG tablet Commonly known as: FOSAMAX Take 70 mg by mouth every Monday.   CALCIUM PO Take 600 mg by mouth daily.   ciprofloxacin -dexamethasone  OTIC suspension Commonly known as: CIPRODEX  Place 4 drops into the right ear 2 (two) times daily. BID for 7 days   famotidine 40 MG tablet Commonly known as: PEPCID Take 40 mg by mouth daily.   levothyroxine  88 MCG tablet Commonly known as: SYNTHROID  Take 88 mcg by mouth daily before breakfast.   losartan  25 MG tablet Commonly known as: COZAAR  Take 12.5 mg by  mouth daily.   meclizine  12.5 MG tablet Commonly known as: ANTIVERT  Take 1 tablet (12.5 mg total) by mouth 3 (three) times daily as needed for dizziness.   metoprolol  succinate 25 MG 24 hr tablet Commonly known as: TOPROL -XL Take 25 mg by mouth daily.   multivitamin with minerals Tabs tablet Take 1 tablet by mouth daily.         Allergies  Allergen Reactions   Fexofenadine Hcl Hives   Latex      redness    Consultations: Neurology/IR   Procedures/Studies: MR BRAIN WO CONTRAST Result Date: 06/16/2024 CLINICAL DATA:  Neuro deficit, acute, stroke suspected EXAM: MRI HEAD WITHOUT CONTRAST TECHNIQUE: Multiplanar, multiecho pulse sequences of the brain and surrounding structures were obtained without intravenous contrast. COMPARISON:  CT head from earlier today. FINDINGS: Brain: No acute infarction, hemorrhage, hydrocephalus, extra-axial collection or mass lesion. Mild for age T2/FLAIR hyperintensities within the white matter, compatible with mild chronic microvascular disease. Remote right basal ganglia perforator infarct. Vascular: Major arterial flow voids are maintained at the skull base. Skull and upper cervical spine: Normal marrow signal. Sinuses/Orbits: Clear sinuses.  No acute orbital findings. IMPRESSION: No evidence of acute intracranial abnormality. Electronically Signed   By: Gilmore GORMAN Molt M.D.   On: 06/16/2024 23:37   CT HEAD WO CONTRAST Result Date: 06/16/2024 CLINICAL DATA:  Neuro deficit, acute, stroke suspected EXAM: CT HEAD WITHOUT CONTRAST TECHNIQUE: Contiguous axial images were obtained from the base of the skull through the vertex without intravenous contrast. RADIATION DOSE REDUCTION: This exam was performed according to the departmental dose-optimization program which includes automated exposure control, adjustment of the mA and/or kV according to patient size and/or use of iterative reconstruction technique. COMPARISON:  MRI head 05/19/2021 FINDINGS: Brain: Patchy and confluent areas of decreased attenuation are noted throughout the deep and periventricular white matter of the cerebral hemispheres bilaterally, compatible with chronic microvascular ischemic disease. Chronic right basal ganglia lacunar infarction. No evidence of large-territorial acute infarction. No parenchymal hemorrhage. No mass lesion. No extra-axial collection. No mass effect or midline shift. No  hydrocephalus. Basilar cisterns are patent. Couple subcentimeter rounded hyperdensity nodules along the pituitary gland consistent with pituitary adenoma noted on MRI head 05/19/2021. Vascular: No hyperdense vessel. Atherosclerotic calcifications are present within the cavernous internal carotid arteries. Skull: No acute fracture or focal lesion. Sinuses/Orbits: Paranasal sinuses and mastoid air cells are clear. Bilateral lens replacement. Otherwise the orbits are unremarkable. Other: None. IMPRESSION: No acute intracranial abnormality. Electronically Signed   By: Morgane  Naveau M.D.   On: 06/16/2024 20:44   MM 3D SCREENING MAMMOGRAM BILATERAL BREAST Result Date: 06/01/2024 CLINICAL DATA:  Screening. EXAM: DIGITAL SCREENING BILATERAL MAMMOGRAM WITH TOMOSYNTHESIS AND CAD TECHNIQUE: Bilateral screening digital craniocaudal and mediolateral oblique mammograms were obtained. Bilateral screening digital breast tomosynthesis was performed. The images were evaluated with computer-aided detection. COMPARISON:  Previous exam(s). ACR Breast Density Category c: The breasts are heterogeneously dense, which may obscure small masses. FINDINGS: There are no findings suspicious for malignancy. IMPRESSION: No mammographic evidence of malignancy. A result letter of this screening mammogram will be mailed directly to the patient. RECOMMENDATION: Screening mammogram in one year. (Code:SM-B-01Y) BI-RADS CATEGORY  1: Negative. Electronically Signed   By: Alm Parkins M.D.   On: 06/01/2024 12:47      Subjective: Patient seen and examined at bedside.  Feels slightly better.  Denies any current dizziness.  Had some bleeding from the right ear overnight which has resolved.  Discharge Exam: Vitals:   06/17/24 2104  06/18/24 0432  BP: (!) 138/59 129/64  Pulse: 72 77  Resp: 17 18  Temp: 98.5 F (36.9 C) 98.1 F (36.7 C)  SpO2: 99% 94%    General: Pt is alert, awake, not in acute distress.  Elderly female lying in bed.   On room air. Cardiovascular: rate controlled, S1/S2 + Respiratory: bilateral decreased breath sounds at bases Abdominal: Soft, NT, ND, bowel sounds + Extremities: no edema, no cyanosis    The results of significant diagnostics from this hospitalization (including imaging, microbiology, ancillary and laboratory) are listed below for reference.     Microbiology: No results found for this or any previous visit (from the past 240 hours).   Labs: BNP (last 3 results) No results for input(s): BNP in the last 8760 hours. Basic Metabolic Panel: Recent Labs  Lab 06/16/24 2005 06/16/24 2009 06/17/24 0840 06/18/24 0529  NA 132* 132* 136 134*  K 4.5 4.6 4.2 3.8  CL 98 99 105 103  CO2 21*  --  23 22  GLUCOSE 137* 141* 101* 95  BUN 13 16 10 13   CREATININE 0.80 0.70 0.81 0.84  CALCIUM 9.2  --  8.7* 8.6*   Liver Function Tests: Recent Labs  Lab 06/16/24 2005  AST 31  ALT 9  ALKPHOS 49  BILITOT 1.1  PROT 6.7  ALBUMIN 4.0   No results for input(s): LIPASE, AMYLASE in the last 168 hours. No results for input(s): AMMONIA in the last 168 hours. CBC: Recent Labs  Lab 06/16/24 2005 06/16/24 2009 06/18/24 0529  WBC 5.0  --  4.9  NEUTROABS 3.8  --   --   HGB 13.6 13.9 12.5  HCT 40.1 41.0 37.1  MCV 89.5  --  89.2  PLT 230  --  208   Cardiac Enzymes: No results for input(s): CKTOTAL, CKMB, CKMBINDEX, TROPONINI in the last 168 hours. BNP: Invalid input(s): POCBNP CBG: No results for input(s): GLUCAP in the last 168 hours. D-Dimer No results for input(s): DDIMER in the last 72 hours. Hgb A1c No results for input(s): HGBA1C in the last 72 hours. Lipid Profile No results for input(s): CHOL, HDL, LDLCALC, TRIG, CHOLHDL, LDLDIRECT in the last 72 hours. Thyroid  function studies Recent Labs    06/17/24 0840  TSH 8.492*   Anemia work up No results for input(s): VITAMINB12, FOLATE, FERRITIN, TIBC, IRON, RETICCTPCT in the last 72  hours. Urinalysis    Component Value Date/Time   COLORURINE YELLOW 06/17/2024 0202   APPEARANCEUR CLEAR 06/17/2024 0202   LABSPEC 1.013 06/17/2024 0202   PHURINE 6.0 06/17/2024 0202   GLUCOSEU NEGATIVE 06/17/2024 0202   HGBUR NEGATIVE 06/17/2024 0202   BILIRUBINUR NEGATIVE 06/17/2024 0202   KETONESUR 20 (A) 06/17/2024 0202   PROTEINUR 30 (A) 06/17/2024 0202   NITRITE NEGATIVE 06/17/2024 0202   LEUKOCYTESUR NEGATIVE 06/17/2024 0202   Sepsis Labs Recent Labs  Lab 06/16/24 2005 06/18/24 0529  WBC 5.0 4.9   Microbiology No results found for this or any previous visit (from the past 240 hours).   Time coordinating discharge: 35 minutes  SIGNED:   Sophie Mao, MD  [facility] 06/18/2024, 9:17 AM

## 2024-06-18 NOTE — Care Management Obs Status (Signed)
 MEDICARE OBSERVATION STATUS NOTIFICATION   Patient Details  Name: Madison Doyle MRN: [id number] Date of Birth: 09-05-1936   Medicare Observation Status Notification Given:  Yes Obs was verbally given and a copy will follow also spoke wth Patient son [name] 06/18/2024, 9:02 AM

## 2024-06-18 NOTE — Progress Notes (Signed)
 Physical Therapy Treatment Patient Details Name: Madison Doyle MRN: [id number] DOB: 31-Oct-1936 Today's Date: 06/18/2024   History of Present Illness Patient is an 88 y/o female admitted 06/16/24 due to dizziness, difficulty hearing with severe cerumen impaction of R ear, and occipital headache.  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    PT Comments  Patient progressing with mobility and able to negotiate stairs with S with rails and increased time.  Vestibular screen negative for hypofunction, some positive central signs with frog hopping at times with saccades though with age and h/o CSF leak, intracranial hypotension not concerned.  Able to complete VOR without increased symptoms.  Noted plans for home today and RN reports RNCM to follow up regarding HHPT.    If plan is discharge home, recommend the following: A little help with walking and/or transfers;Help with stairs or ramp for entrance   Can travel by private vehicle        Equipment Recommendations  None recommended by PT    Recommendations for Other Services       Precautions / Restrictions Precautions Precautions: Fall Recall of Precautions/Restrictions: Intact     Mobility  Bed Mobility               General bed mobility comments: in recliner    Transfers   Equipment used: Rolling walker (2 wheels) Transfers: Sit to/from Stand Sit to Stand: Supervision                Ambulation/Gait Ambulation/Gait assistance: Supervision Gait Distance (Feet): 120 Feet Assistive device: Rolling walker (2 wheels) Gait Pattern/deviations: Step-to pattern, Step-through pattern       General Gait Details: slow pace and increased time turning walker   Stairs Stairs: Yes Stairs assistance: Supervision, Contact guard assist Stair Management: One rail Right, Two rails, Step to pattern, Forwards Number of  Stairs: 10 General stair comments: slow but steady, taking her time appropriately   Wheelchair Mobility     Tilt Bed    Modified Rankin (Stroke Patients Only)       Balance Overall balance assessment: Needs assistance   Sitting balance-Leahy Scale: Good     Standing balance support: Bilateral upper extremity supported, No upper extremity supported Standing balance-Leahy Scale: Fair                              Hotel manager: Impaired Factors Affecting Communication: Hearing impaired  Cognition Arousal: Alert Behavior During Therapy: WFL for tasks assessed/performed   PT - Cognitive impairments: No apparent impairments                         Following commands: Intact      Cueing Cueing Techniques: Verbal cues  Exercises      General Comments General comments (skin integrity, edema, etc.): vestibular screen positive for some frog hopping saccades, no nystagmus noted, negative head thrust test and negative horizontal head shake test, intact VOR vertical and horizontal without provocation of symptoms.  Discussed not concerned with vestibular pathology despite ear wax impaction and potential infection.  Son and daughter in law arrived end of session and updated on plans for HHPT.      Pertinent Vitals/Pain Pain Assessment Faces Pain Scale: Hurts little more Pain Location: headache Pain Descriptors / Indicators: Headache Pain Intervention(s): Monitored during session  Home Living                          Prior Function            PT Goals (current goals can now be found in the care plan section) Progress towards PT goals: Progressing toward goals    Frequency    Min 3X/week      PT Plan      Co-evaluation              AM-PAC PT 6 Clicks Mobility   Outcome Measure  Help needed turning from your back to your side while in a flat bed without using bedrails?: None Help needed  moving from lying on your back to sitting on the side of a flat bed without using bedrails?: None Help needed moving to and from a bed to a chair (including a wheelchair)?: None Help needed standing up from a chair using your arms (e.g., wheelchair or bedside chair)?: None Help needed to walk in hospital room?: None Help needed climbing 3-5 steps with a railing? : A Little 6 Click Score: 23    End of Session Equipment Utilized During Treatment: Gait belt Activity Tolerance: Patient tolerated treatment well Patient left: in chair;with call bell/phone within reach;with family/visitor present         Time: 9071-9051 PT Time Calculation (min) (ACUTE ONLY): 20 min  Charges:    $Neuromuscular Re-education: 8-22 mins PT General Charges $$ ACUTE PT VISIT: 1 Visit                     Micheline Portal, PT Acute Rehabilitation Services Office:[phone] 06/18/2024    [name] 06/18/2024, 12:24 PM

## 2024-06-18 NOTE — Progress Notes (Signed)
 Mobility Specialist Progress Note:    06/18/24 1152  Mobility  Activity Ambulated with assistance  Level of Assistance Standby assist, set-up cues, supervision of patient - no hands on  Assistive Device Front wheel walker  Distance Ambulated (ft) 10 ft  Activity Response Tolerated well  Mobility Referral Yes  Mobility visit 1 Mobility  Mobility Specialist Start Time (ACUTE ONLY) 0920  Mobility Specialist Stop Time (ACUTE ONLY) 0930  Mobility Specialist Time Calculation (min) (ACUTE ONLY) 10 min   Pt received in BR requesting assistance getting back to the chair. No physical assistance required. Pt declined further ambulation d/t wanting to wait to take to care team about discharge. Left in chair w/ call bell and personal belongings in reach. All needs met.  Thersia Minder Mobility Specialist  Please contact vis Secure Chat or  Rehab Office [phone]

## 2024-06-18 NOTE — TOC Transition Note (Signed)
 Transition of Care [facility]) - Discharge Note   Patient Details  Name: Madison Doyle MRN: [id number] Date of Birth: [birth year]  Transition of Care [facility]) CM/SW Contact:  Tom-Johnson, Harvest Muskrat, RN Phone Number: 06/18/2024, 10:19 AM   Clinical Narrative:     Patient is scheduled for discharge today.  Home health info, Outpatient referral, hospital f/u and discharge instructions on AVS. Son, Franky at bedside and will transport at discharge.  No further TOC needs noted.        Final next level of care: Home w Home Health Services Barriers to Discharge: Barriers Resolved   Patient Goals and CMS Choice Patient states their goals for this hospitalization and ongoing recovery are:: To return  home CMS Medicare.gov Compare Post Acute Care list provided to:: Patient Choice offered to / list presented to : Patient, Adult Children (Son, Franky)      Discharge Placement                Patient to be transferred to facility by: Son Name of family member notified: Franky    Discharge Plan and Services Additional resources added to the After Visit Summary for                  DME Arranged: N/A DME Agency: NA       HH Arranged: PT HH Agency: [facility] Date HH Agency Contacted: 06/18/24 Time HH Agency Contacted: 1015 Representative spoke with at [facility] Agency: [place]  Social Drivers of Health (SDOH) Interventions SDOH Screenings   Food Insecurity: Patient Declined (06/17/2024)  Housing: Patient Declined (06/17/2024)  Transportation Needs: No Transportation Needs (06/17/2024)  Utilities: Not At Risk (06/17/2024)  Social Connections: Patient Declined (06/17/2024)  Tobacco Use: Low Risk  (05/27/2024)     Readmission Risk Interventions     No data to display

## 2024-06-18 NOTE — Consult Note (Signed)
 Reason for Consult:dcerumen impaction Referring Physician: dr cheryle Alfonse [name] is an 88 y.o. female.  HPI: hx of right ear decreased hearing while in hospital. The ear was noted to have cerumen and irrigated out. She then had bleeding from ear which has stopped. She still has slight difference in the hearing. She has not had significant cerumen previously. No pain no drainage  No past medical history on file.  Past Surgical History:  Procedure Laterality Date   PARTIAL HIP ARTHROPLASTY      No family history on file.  Social History:  reports that she has never smoked. She has never used smokeless tobacco. No history on file for alcohol  use and drug use.  Allergies:  Allergies  Allergen Reactions   Fexofenadine Hcl Hives   Latex     redness    Medications: I have reviewed the patient's current medications.  Results for orders placed or performed during the hospital encounter of 06/16/24 (from the past 48 hours)  Ethanol     Status: None   Collection Time: 06/16/24  8:05 PM  Result Value Ref Range   Alcohol , Ethyl (B) <15 <15 mg/dL    Comment: (NOTE) For medical purposes only. Performed at [facility], [address] [place], [place] [address]   Protime-INR     Status: None   Collection Time: 06/16/24  8:05 PM  Result Value Ref Range   Prothrombin Time 14.0 11.4 - 15.2 seconds   INR 1.0 0.8 - 1.2    Comment: (NOTE) INR goal varies based on device and disease states. Performed at [facility], [address] [place], [place] [address]   APTT     Status: None   Collection Time: 06/16/24  8:05 PM  Result Value Ref Range   aPTT 29 24 - 36 seconds    Comment: Performed at [facility], [address] [place], [place] [address]  CBC     Status: None   Collection Time: 06/16/24  8:05 PM  Result Value Ref Range   WBC 5.0 4.0 - 10.5 K/uL   RBC 4.48 3.87 - 5.11 MIL/uL   Hemoglobin 13.6 12.0 - 15.0 g/dL   HCT 59.8 63.9 - 53.9 %   MCV 89.5  80.0 - 100.0 fL   MCH 30.4 26.0 - 34.0 pg   MCHC 33.9 30.0 - 36.0 g/dL   RDW 87.5 88.4 - 84.4 %   Platelets 230 150 - 400 K/uL   nRBC 0.0 0.0 - 0.2 %    Comment: Performed at [facility], [address], [place], [place] [address]  Differential     Status: None   Collection Time: 06/16/24  8:05 PM  Result Value Ref Range   Neutrophils Relative % 76 %   Neutro Abs 3.8 1.7 - 7.7 K/uL   Lymphocytes Relative 13 %   Lymphs Abs 0.7 0.7 - 4.0 K/uL   Monocytes Relative 8 %   Monocytes Absolute 0.4 0.1 - 1.0 K/uL   Eosinophils Relative 1 %   Eosinophils Absolute 0.0 0.0 - 0.5 K/uL   Basophils Relative 1 %   Basophils Absolute 0.0 0.0 - 0.1 K/uL   Immature Granulocytes 1 %   Abs Immature Granulocytes 0.03 0.00 - 0.07 K/uL    Comment: Performed at [facility], [address] [place], [place] [address]  Comprehensive metabolic panel     Status: Abnormal   Collection Time: 06/16/24  8:05  PM  Result Value Ref Range   Sodium 132 (L) 135 - 145 mmol/L   Potassium 4.5 3.5 - 5.1 mmol/L   Chloride 98 98 - 111 mmol/L   CO2 21 (L) 22 - 32 mmol/L   Glucose, Bld 137 (H) 70 - 99 mg/dL    Comment: Glucose reference range applies only to samples taken after fasting for at least 8 hours.   BUN 13 8 - 23 mg/dL   Creatinine, Ser 9.19 0.44 - 1.00 mg/dL   Calcium 9.2 8.9 - 89.6 mg/dL   Total Protein 6.7 6.5 - 8.1 g/dL   Albumin 4.0 3.5 - 5.0 g/dL   AST 31 15 - 41 U/L   ALT 9 0 - 44 U/L   Alkaline Phosphatase 49 38 - 126 U/L   Total Bilirubin 1.1 0.0 - 1.2 mg/dL   GFR, Estimated >39 >39 mL/min    Comment: (NOTE) Calculated using the CKD-EPI Creatinine Equation (2021)    Anion gap 13 5 - 15    Comment: Performed at [facility], [address] [place], [place] [address]  I-stat chem 8, ED     Status: Abnormal   Collection Time: 06/16/24  8:09 PM  Result Value Ref Range   Sodium 132 (L) 135 - 145 mmol/L   Potassium 4.6 3.5 - 5.1 mmol/L   Chloride 99 98 - 111 mmol/L   BUN 16 8  - 23 mg/dL   Creatinine, Ser 9.29 0.44 - 1.00 mg/dL   Glucose, Bld 858 (H) 70 - 99 mg/dL    Comment: Glucose reference range applies only to samples taken after fasting for at least 8 hours.   Calcium, Ion 1.11 (L) 1.15 - 1.40 mmol/L   TCO2 24 22 - 32 mmol/L   Hemoglobin 13.9 12.0 - 15.0 g/dL   HCT 58.9 63.9 - 53.9 %  Urine rapid drug screen (hosp performed)     Status: None   Collection Time: 06/17/24  2:02 AM  Result Value Ref Range   Opiates NONE DETECTED NONE DETECTED   Cocaine NONE DETECTED NONE DETECTED   Benzodiazepines NONE DETECTED NONE DETECTED   Amphetamines NONE DETECTED NONE DETECTED   Tetrahydrocannabinol NONE DETECTED NONE DETECTED   Barbiturates NONE DETECTED NONE DETECTED    Comment: (NOTE) DRUG SCREEN FOR MEDICAL PURPOSES ONLY.  IF CONFIRMATION IS NEEDED FOR ANY PURPOSE, NOTIFY LAB WITHIN 5 DAYS.  LOWEST DETECTABLE LIMITS FOR URINE DRUG SCREEN Drug Class                     Cutoff (ng/mL) Amphetamine and metabolites    1000 Barbiturate and metabolites    200 Benzodiazepine                 200 Opiates and metabolites        300 Cocaine and metabolites        300 THC                            50 Performed at [facility], [address], [place], [place] [address]   Urinalysis, w/ Reflex to Culture (Infection Suspected) -Urine, Clean Catch     Status: Abnormal   Collection Time: 06/17/24  2:02 AM  Result Value Ref Range   Specimen Source URINE, CLEAN CATCH    Color, Urine YELLOW YELLOW   APPearance CLEAR CLEAR   Specific Gravity, Urine 1.013 1.005 - 1.030  pH 6.0 5.0 - 8.0   Glucose, UA NEGATIVE NEGATIVE mg/dL   Hgb urine dipstick NEGATIVE NEGATIVE   Bilirubin Urine NEGATIVE NEGATIVE   Ketones, ur 20 (A) NEGATIVE mg/dL   Protein, ur 30 (A) NEGATIVE mg/dL   Nitrite NEGATIVE NEGATIVE   Leukocytes,Ua NEGATIVE NEGATIVE   RBC / HPF 0-5 0 - 5 RBC/hpf   WBC, UA 0-5 0 - 5 WBC/hpf    Comment:        Reflex urine culture not performed if WBC <=10,  OR if Squamous epithelial cells >5. If Squamous epithelial cells >5 suggest recollection.    Bacteria, UA NONE SEEN NONE SEEN   Squamous Epithelial / HPF 0-5 0 - 5 /HPF    Comment: Performed at [facility], [address], [place], [place] [address]  Basic metabolic panel     Status: Abnormal   Collection Time: 06/17/24  8:40 AM  Result Value Ref Range   Sodium 136 135 - 145 mmol/L   Potassium 4.2 3.5 - 5.1 mmol/L   Chloride 105 98 - 111 mmol/L   CO2 23 22 - 32 mmol/L   Glucose, Bld 101 (H) 70 - 99 mg/dL    Comment: Glucose reference range applies only to samples taken after fasting for at least 8 hours.   BUN 10 8 - 23 mg/dL   Creatinine, Ser 9.18 0.44 - 1.00 mg/dL   Calcium 8.7 (L) 8.9 - 10.3 mg/dL   GFR, Estimated >39 >39 mL/min    Comment: (NOTE) Calculated using the CKD-EPI Creatinine Equation (2021)    Anion gap 8 5 - 15    Comment: Performed at [facility], [address], [place], [place] [address]  TSH     Status: Abnormal   Collection Time: 06/17/24  8:40 AM  Result Value Ref Range   TSH 8.492 (H) 0.350 - 4.500 uIU/mL    Comment: Performed by a 3rd Generation assay with a functional sensitivity of <=0.01 uIU/mL. Performed at [facility], [address], [place], [place] [address]   CBC     Status: None   Collection Time: 06/18/24  5:29 AM  Result Value Ref Range   WBC 4.9 4.0 - 10.5 K/uL   RBC 4.16 3.87 - 5.11 MIL/uL   Hemoglobin 12.5 12.0 - 15.0 g/dL   HCT 62.8 63.9 - 53.9 %   MCV 89.2 80.0 - 100.0 fL   MCH 30.0 26.0 - 34.0 pg   MCHC 33.7 30.0 - 36.0 g/dL   RDW 86.9 88.4 - 84.4 %   Platelets 208 150 - 400 K/uL   nRBC 0.0 0.0 - 0.2 %    Comment: Performed at [facility], [address], [place], [place] [address]  Basic metabolic panel     Status: Abnormal   Collection Time: 06/18/24  5:29 AM  Result Value Ref Range   Sodium 134 (L) 135 - 145 mmol/L   Potassium 3.8 3.5 - 5.1 mmol/L   Chloride 103 98 - 111 mmol/L   CO2 22 22 -  32 mmol/L   Glucose, Bld 95 70 - 99 mg/dL    Comment: Glucose reference range applies only to samples taken after fasting for at least 8 hours.   BUN 13 8 - 23 mg/dL   Creatinine, Ser 9.15 0.44 - 1.00 mg/dL   Calcium 8.6 (L) 8.9 - 10.3 mg/dL   GFR, Estimated >39 >39 mL/min    Comment: (NOTE) Calculated using the  CKD-EPI Creatinine Equation (2021)    Anion gap 9 5 - 15    Comment: Performed at [facility], [address], [place], [place] [address]    MR BRAIN WO CONTRAST Result Date: 06/16/2024 CLINICAL DATA:  Neuro deficit, acute, stroke suspected EXAM: MRI HEAD WITHOUT CONTRAST TECHNIQUE: Multiplanar, multiecho pulse sequences of the brain and surrounding structures were obtained without intravenous contrast. COMPARISON:  CT head from earlier today. FINDINGS: Brain: No acute infarction, hemorrhage, hydrocephalus, extra-axial collection or mass lesion. Mild for age T2/FLAIR hyperintensities within the white matter, compatible with mild chronic microvascular disease. Remote right basal ganglia perforator infarct. Vascular: Major arterial flow voids are maintained at the skull base. Skull and upper cervical spine: Normal marrow signal. Sinuses/Orbits: Clear sinuses.  No acute orbital findings. IMPRESSION: No evidence of acute intracranial abnormality. Electronically Signed   By: Gilmore GORMAN Molt M.D.   On: 06/16/2024 23:37   CT HEAD WO CONTRAST Result Date: 06/16/2024 CLINICAL DATA:  Neuro deficit, acute, stroke suspected EXAM: CT HEAD WITHOUT CONTRAST TECHNIQUE: Contiguous axial images were obtained from the base of the skull through the vertex without intravenous contrast. RADIATION DOSE REDUCTION: This exam was performed according to the departmental dose-optimization program which includes automated exposure control, adjustment of the mA and/or kV according to patient size and/or use of iterative reconstruction technique. COMPARISON:  MRI head 05/19/2021 FINDINGS: Brain: Patchy and  confluent areas of decreased attenuation are noted throughout the deep and periventricular white matter of the cerebral hemispheres bilaterally, compatible with chronic microvascular ischemic disease. Chronic right basal ganglia lacunar infarction. No evidence of large-territorial acute infarction. No parenchymal hemorrhage. No mass lesion. No extra-axial collection. No mass effect or midline shift. No hydrocephalus. Basilar cisterns are patent. Couple subcentimeter rounded hyperdensity nodules along the pituitary gland consistent with pituitary adenoma noted on MRI head 05/19/2021. Vascular: No hyperdense vessel. Atherosclerotic calcifications are present within the cavernous internal carotid arteries. Skull: No acute fracture or focal lesion. Sinuses/Orbits: Paranasal sinuses and mastoid air cells are clear. Bilateral lens replacement. Otherwise the orbits are unremarkable. Other: None. IMPRESSION: No acute intracranial abnormality. Electronically Signed   By: Morgane  Naveau M.D.   On: 06/16/2024 20:44    ROS Blood pressure 129/64, pulse 77, temperature 98.1 F (36.7 C), temperature source Oral, resp. rate 18, height 5' 6 (1.676 m), weight 61.2 kg, SpO2 94%. Physical Exam HENT:     Head: Normocephalic.  Eyes:     Extraocular Movements: Extraocular movements intact.     Comments: Left ear clear right ear with dried blood and cerumen. Cannot see TM. No exudate or pus. Grossly hearing intact. Vii nerve intact  Musculoskeletal:     Cervical back: Normal range of motion.  Neurological:     Mental Status: She is alert.       Assessment/Plan: Right otitis externa mild and cerumen impaction- she needs microscope exam and cleaning. Follow up in ENT clinic [phone]. Ciprodex  4 drops right ear BID for 7 days.   Norleen Notice 06/18/2024, 11:37 AM

## 2024-06-26 ENCOUNTER — Ambulatory Visit (INDEPENDENT_AMBULATORY_CARE_PROVIDER_SITE_OTHER): Admitting: Physician Assistant

## 2024-06-26 ENCOUNTER — Encounter (INDEPENDENT_AMBULATORY_CARE_PROVIDER_SITE_OTHER): Payer: Self-pay | Admitting: Physician Assistant

## 2024-06-26 VITALS — BP 151/94 | HR 84

## 2024-06-26 DIAGNOSIS — H6122 Impacted cerumen, left ear: Secondary | ICD-10-CM | POA: Diagnosis not present

## 2024-06-26 MED ORDER — MECLIZINE HCL 12.5 MG PO TABS: 12.5000 mg | ORAL_TABLET | ORAL | 3 refills | Status: AC | PRN

## 2024-06-26 NOTE — Progress Notes (Unsigned)
 Dear Dr. Loreli, Here is my assessment for our mutual patient, Madison Doyle. Thank you for allowing me the opportunity to care for your patient. Please do not hesitate to contact me should you have any other questions. Sincerely, Chyrl Cohen PA-C  Otolaryngology Clinic Note Referring provider: Dr. Loreli HPI:  Madison Doyle is a 88 y.o. female kindly referred by Dr. Loreli   The patient is an 88 year old female seen in our office for evaluation of cerumen impaction.  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  The patient recently had worsening headache, muffled hearing in her ears, she attempted to having her primary care provider remove the earwax, she had worsening symptoms at that time.  She went to the emergency room.  She had CT head and MRI brain with no acute intracranial abnormality, neurology recommended IR consultation for possible epidural blood Placek.  She continues to see CSF clinic at [facility] for her ongoing issues.  While hospitalized it was noted she had cerumen impaction.  They attempted irrigation and curettage, this caused bleeding in the right external auditory canal.  Dr. Roark was consulted who recommended outpatient follow-up for microscopic exam.  The patient notes today she wears hearing aids, she does note worsening hearing.  She notes ongoing headache and dizziness similar to previous.  She currently takes meclizine  which she feels helps with her headache.  Independent Review of Additional Tests or Records:  Discharge summary on 06/18/2024, consult note 06/18/2024   PMH/Meds/All/SocHx/FamHx/ROS:  History reviewed. No pertinent past medical history.   Past Surgical History:  Procedure Laterality Date   PARTIAL HIP ARTHROPLASTY      History reviewed. No pertinent family history.   Social Connections: Patient Declined (06/17/2024)    Social Connection and Isolation Panel    Frequency of Communication with Friends and Family: Patient declined    Frequency of Social Gatherings with Friends and Family: Patient declined    Attends Religious Services: Patient declined    Database administrator or Organizations: Patient declined    Attends Banker Meetings: Patient declined    Marital Status: Patient declined      Current Outpatient Medications:    alendronate (FOSAMAX) 70 MG tablet, Take 70 mg by mouth every Monday., Disp: , Rfl:    CALCIUM PO, Take 600 mg by mouth daily., Disp: , Rfl:    ciprofloxacin -dexamethasone  (CIPRODEX ) OTIC suspension, Place 4 drops into the right ear 2 (two) times daily. BID for 7 days, Disp: 7.5 mL, Rfl: 0   famotidine (PEPCID) 40 MG tablet, Take 40 mg by mouth daily., Disp: , Rfl:    levothyroxine  (SYNTHROID ) 88 MCG tablet, Take 88 mcg by mouth daily before breakfast., Disp: , Rfl:    losartan  (COZAAR ) 25 MG tablet, Take 12.5 mg by mouth daily., Disp: , Rfl:    meclizine  (ANTIVERT ) 12.5 MG tablet, Take 1 tablet (12.5 mg total) by mouth 3 (three) times daily as needed for dizziness., Disp: 30 tablet, Rfl: 0   metoprolol  succinate (TOPROL -XL) 25 MG 24 hr tablet, Take 25 mg by mouth daily., Disp: , Rfl:    Multiple Vitamin (MULTIVITAMIN WITH MINERALS) TABS tablet, Take 1 tablet by mouth daily., Disp: , Rfl:    Physical Exam:   BP (!) 151/94   Pulse 84   SpO2 96%   Pertinent Findings  CN II-XII intact Left external auditory  canal with dried blood and cerumen, no discharge, right EAC clear, TM intact with well-pneumatized middle ear space Anterior rhinoscopy: Septum midline; bilateral inferior turbinates with normal hypertrophy No lesions of oral cavity/oropharynx; dentition limits No obviously palpable neck masses/lymphadenopathy/thyromegaly No respiratory distress or stridor  Seprately Identifiable Procedures:  Procedure: bilateral ear microscopy and cerumen removal using  microscope (CPT [phone]) - Mod 25 Pre-procedure diagnosis: unilateral cerumen impaction left external auditory canal Post-procedure diagnosis: same Indication: bilateral cerumen impaction; given patient's otologic complaints and history as well as for improved and comprehensive examination of external ear and tympanic membrane, bilateral otologic examination using microscope was performed and impacted cerumen removed  Procedure: Patient was placed semi-recumbent. Both ear canals were examined using the microscope with findings above. Cerumen removed from the left external auditory canal using suction and currette with improvement in EAC examination and patency. Left: EAC was patent. TM was intact . Middle ear was aerated. Drainage: none Right: EAC was patent. TM was intact . Middle ear was aerated . Drainage: none Patient tolerated the procedure well.   Impression & Plans:  Madison Doyle is a 88 y.o. female with the following   Cerumen impaction-  88 year old female seen today for evaluation of cerumen impaction.  She did have some irritation to help.  Cerumen was removed, I would recommend continue using the previous prescribed Ciprodex  for the next 3 to 5 days.  I would like to see the patient back in our office in approximately a month for repeat evaluation or sooner as needed.  The patient and her family verbalized understanding and agreement to today's plan had no further questions or concerns.   - f/u 1 month in office   Thank you for allowing me the opportunity to care for your patient. Please do not hesitate to contact me should you have any other questions.  Sincerely, Chyrl Cohen PA-C [facility] Phone: [phone] Fax: [phone]  06/26/2024, 11:37 AM

## 2024-06-30 DIAGNOSIS — R519 Headache, unspecified: Secondary | ICD-10-CM | POA: Diagnosis not present

## 2024-07-17 ENCOUNTER — Ambulatory Visit (INDEPENDENT_AMBULATORY_CARE_PROVIDER_SITE_OTHER): Admitting: Physician Assistant

## 2024-07-17 VITALS — BP 149/84 | HR 63

## 2024-07-17 DIAGNOSIS — Z09 Encounter for follow-up examination after completed treatment for conditions other than malignant neoplasm: Secondary | ICD-10-CM

## 2024-07-17 DIAGNOSIS — Z8669 Personal history of other diseases of the nervous system and sense organs: Secondary | ICD-10-CM

## 2024-07-17 DIAGNOSIS — H6122 Impacted cerumen, left ear: Secondary | ICD-10-CM

## 2024-07-17 NOTE — Progress Notes (Signed)
 Dear Dr. Loreli, Here is my assessment for our mutual patient, Madison Doyle. Thank you for allowing me the opportunity to care for your patient. Please do not hesitate to contact me should you have any other questions. Sincerely, Chyrl Cohen PA-C  Otolaryngology Clinic Note Referring provider: Dr. Loreli HPI:  Madison Doyle is a 88 y.o. female kindly referred by Dr. Loreli   The patient is an 88 year old female seen in our office for follow-up evaluation.  The patient was last seen in the office on 06/26/2024.  Below is her Of that encounter.   The patient is an 88 year old female seen in our office for evaluation of cerumen impaction.  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  The patient recently had worsening headache, muffled hearing in her ears, she attempted to having her primary care provider remove the earwax, she had worsening symptoms at that time.  She went to the emergency room.  She had CT head and MRI brain with no acute intracranial abnormality, neurology recommended IR consultation for possible epidural blood Placek.  She continues to see CSF clinic at [facility] for her ongoing issues.  While hospitalized it was noted she had cerumen impaction.  They attempted irrigation and curettage, this caused bleeding in the right external auditory canal.  Dr. Roark was consulted who recommended outpatient follow-up for microscopic exam.  The patient notes today she wears hearing aids, she does note worsening hearing.  She notes ongoing headache and dizziness similar to previous.  She currently takes meclizine  which she feels helps with her headache.    Update 07/17/2024.  Since her last office visit she denies any significant changes.  She denies any pain in the ears, no drainage, she did use the Ciprodex  drops as recommended.  She notes that she continues to  have the dizziness and will be following up the CSF clinic in [place] for her ongoing issues.  She has a scheduled audiological evaluation coming up in October.   Independent Review of Additional Tests or Records:  None   PMH/Meds/All/SocHx/FamHx/ROS:  No past medical history on file.   Past Surgical History:  Procedure Laterality Date   PARTIAL HIP ARTHROPLASTY      No family history on file.   Social Connections: Patient Declined (06/17/2024)   Social Connection and Isolation Panel    Frequency of Communication with Friends and Family: Patient declined    Frequency of Social Gatherings with Friends and Family: Patient declined    Attends Religious Services: Patient declined    Database administrator or Organizations: Patient declined    Attends Banker Meetings: Patient declined    Marital Status: Patient declined      Current Outpatient Medications:    alendronate (FOSAMAX) 70 MG tablet, Take 70 mg by mouth every Monday., Disp: , Rfl:    CALCIUM PO, Take 600 mg by mouth daily., Disp: , Rfl:    ciprofloxacin -dexamethasone  (CIPRODEX ) OTIC suspension, Place 4 drops into the right ear 2 (two) times daily. BID for 7 days, Disp: 7.5 mL, Rfl: 0   famotidine (PEPCID) 40 MG tablet, Take 40 mg by mouth daily., Disp: , Rfl:    levothyroxine  (SYNTHROID ) 88 MCG tablet, Take 88 mcg by mouth daily before breakfast., Disp: , Rfl:    losartan  (COZAAR ) 25 MG tablet, Take 12.5 mg by mouth daily., Disp: , Rfl:  meclizine  (ANTIVERT ) 12.5 MG tablet, Take 1 tablet (12.5 mg total) by mouth 3 (three) times daily as needed for dizziness., Disp: 30 tablet, Rfl: 0   meclizine  (ANTIVERT ) 12.5 MG tablet, Take 1 tablet (12.5 mg total) by mouth every 4 (four) hours as needed for dizziness., Disp: 30 tablet, Rfl: 3   metoprolol  succinate (TOPROL -XL) 25 MG 24 hr tablet, Take 25 mg by mouth daily., Disp: , Rfl:    Multiple Vitamin (MULTIVITAMIN WITH MINERALS) TABS tablet, Take 1 tablet by mouth  daily., Disp: , Rfl:    Physical Exam:   BP (!) 149/84   Pulse 63   SpO2 96%   Pertinent Findings  CN II-XII intact Bilateral EAC clear and TM intact with well pneumatized middle ear spaces Right external ear with a circular raised lesion approximately 0.5 cm No obviously palpable neck masses/lymphadenopathy/thyromegaly No respiratory distress or stridor  Seprately Identifiable Procedures:  None  Impression & Plans:  Madison Doyle is a 88 y.o. female with the following   Cerumen impaction-  No reaccumulation of cerumen, no signs of otitis externa or lesions of the external auditory canal.  Ear lesion-   The patient does have a suspicious lesion on the right external ear, she notes she has a dermatologist and follows with them closely, she will follow-up with them for evaluation of this.    - f/u PRN, phone call discussion with audiological results when available   Thank you for allowing me the opportunity to care for your patient. Please do not hesitate to contact me should you have any other questions.  Sincerely, Chyrl Cohen PA-C [facility] Phone: [phone] Fax: [phone]  07/17/2024, 1:18 PM

## 2024-07-28 DIAGNOSIS — C3432 Malignant neoplasm of lower lobe, left bronchus or lung: Secondary | ICD-10-CM | POA: Diagnosis not present

## 2024-07-28 DIAGNOSIS — C342 Malignant neoplasm of middle lobe, bronchus or lung: Secondary | ICD-10-CM | POA: Diagnosis not present

## 2024-07-28 DIAGNOSIS — C3490 Malignant neoplasm of unspecified part of unspecified bronchus or lung: Secondary | ICD-10-CM | POA: Diagnosis not present

## 2024-07-28 DIAGNOSIS — Z85828 Personal history of other malignant neoplasm of skin: Secondary | ICD-10-CM | POA: Diagnosis not present

## 2024-07-29 DIAGNOSIS — R51 Headache with orthostatic component, not elsewhere classified: Secondary | ICD-10-CM | POA: Diagnosis not present

## 2024-08-04 DIAGNOSIS — H16223 Keratoconjunctivitis sicca, not specified as Sjogren's, bilateral: Secondary | ICD-10-CM | POA: Diagnosis not present

## 2024-08-10 DIAGNOSIS — D0471 Carcinoma in situ of skin of right lower limb, including hip: Secondary | ICD-10-CM | POA: Diagnosis not present

## 2024-08-10 DIAGNOSIS — L821 Other seborrheic keratosis: Secondary | ICD-10-CM | POA: Diagnosis not present

## 2024-08-10 DIAGNOSIS — Z85828 Personal history of other malignant neoplasm of skin: Secondary | ICD-10-CM | POA: Diagnosis not present

## 2024-08-10 DIAGNOSIS — C44722 Squamous cell carcinoma of skin of right lower limb, including hip: Secondary | ICD-10-CM | POA: Diagnosis not present

## 2024-08-10 DIAGNOSIS — H61001 Unspecified perichondritis of right external ear: Secondary | ICD-10-CM | POA: Diagnosis not present

## 2024-08-10 DIAGNOSIS — L812 Freckles: Secondary | ICD-10-CM | POA: Diagnosis not present

## 2024-08-12 DIAGNOSIS — R519 Headache, unspecified: Secondary | ICD-10-CM | POA: Diagnosis not present

## 2024-08-12 DIAGNOSIS — G96811 Intracranial hypotension, spontaneous: Secondary | ICD-10-CM | POA: Diagnosis not present

## 2024-08-12 DIAGNOSIS — G9600 Cerebrospinal fluid leak, unspecified: Secondary | ICD-10-CM | POA: Diagnosis not present

## 2024-08-12 DIAGNOSIS — R2689 Other abnormalities of gait and mobility: Secondary | ICD-10-CM | POA: Diagnosis not present

## 2024-08-12 DIAGNOSIS — G43909 Migraine, unspecified, not intractable, without status migrainosus: Secondary | ICD-10-CM | POA: Diagnosis not present

## 2024-08-13 ENCOUNTER — Ambulatory Visit (INDEPENDENT_AMBULATORY_CARE_PROVIDER_SITE_OTHER): Admitting: Audiology

## 2024-08-13 DIAGNOSIS — H9072 Mixed conductive and sensorineural hearing loss, unilateral, left ear, with unrestricted hearing on the contralateral side: Secondary | ICD-10-CM | POA: Diagnosis not present

## 2024-08-13 DIAGNOSIS — H90A32 Mixed conductive and sensorineural hearing loss, unilateral, left ear with restricted hearing on the contralateral side: Secondary | ICD-10-CM

## 2024-08-13 DIAGNOSIS — H9041 Sensorineural hearing loss, unilateral, right ear, with unrestricted hearing on the contralateral side: Secondary | ICD-10-CM

## 2024-08-13 DIAGNOSIS — H90A21 Sensorineural hearing loss, unilateral, right ear, with restricted hearing on the contralateral side: Secondary | ICD-10-CM

## 2024-08-13 NOTE — Progress Notes (Signed)
  [address] [place], [place] [address] [phone]  Audiological Evaluation    Name: Madison Doyle     DOB:   05-31-1936      MRN:   [id number]                                                                                     Service Date: 08/13/2024     Accompanied by: son   Patient comes today after Reyes Cohen, PA-C sent a referral for a hearing evaluation due to concerns with dizziness.   Symptoms Yes Details  Hearing loss  [x]  Cannot say if she can hear better from one ear or not. Known longstanding bilateral hearing loss.  Tinnitus  []    Ear pain/ infections/pressure  []    Balance problems  [x]  Reports to be off balance  Noise exposure history  []    Previous ear surgeries  []    Family history of hearing loss  []    Amplification  [x]  Has hearing aids from [place].  Other  [x]  Reports concerns with CSF leak.    Otoscopy: Right ear: Clear external ear canal and notable landmarks visualized on the tympanic membrane. Left ear:  Clear external ear canal and notable landmarks visualized on the tympanic membrane.  Tympanometry: Right ear: Type A- Normal external ear canal volume with normal middle ear pressure and tympanic membrane compliance. Left ear: Type B- Normal external ear canal volume with no middle ear pressure peak or tympanic membrane compliance.  Pure tone Audiometry: Right ear- Normal to profound sensorineural hearing loss from 125 Hz - 8000 Hz. Left ear-  Mild to profound mixed hearing loss from 125 Hz - 8000 Hz.  Speech Audiometry: Right ear- Speech Reception Threshold (SRT) was obtained at 30 dBHL. Left ear-Speech Reception Threshold (SRT) was obtained at 40 dBHL.   Word Recognition Score Tested using NU-6 (recorded) Right ear: 88% was obtained at a presentation level of 80 dBHL with contralateral masking which is deemed as  good . Left ear: 88% was obtained at a presentation level of 80 dBHL with contralateral masking which is deemed  as  good .   The hearing test results were completed under headphones (inserts for [facility] masking) and results are deemed to be of good to fair reliability. Test technique:  conventional    Impression: There is  a significant difference in pure-tone thresholds between ears, worse in the left ear from [phone] Hz.   Recommendations: Follow up with ENT . Return for a hearing evaluation per MD recommendation.   Hale Chalfin MARIE LEROUX-MARTINEZ, AUD

## 2024-08-14 ENCOUNTER — Telehealth (INDEPENDENT_AMBULATORY_CARE_PROVIDER_SITE_OTHER): Payer: Self-pay

## 2024-08-14 ENCOUNTER — Telehealth (INDEPENDENT_AMBULATORY_CARE_PROVIDER_SITE_OTHER): Payer: Self-pay | Admitting: Physician Assistant

## 2024-08-14 MED ORDER — FLUTICASONE PROPIONATE 50 MCG/ACT NA SUSP: 2.0000 | Freq: Every day | NASAL | 6 refills | Status: AC

## 2024-08-14 NOTE — Telephone Encounter (Signed)
 Madison Doyle called regarding her audiological evaluations completed yesterday.  Upon review she has some mixed hearing loss with air-bone gap at 250 Hz and a left type B tympanometry.  She denies any recent upper respiratory infections, no unilateral symptoms.  Review of CT completed in August 2025 shows no middle ear effusion.  I discussed the findings with her, she has ongoing evaluation for CSF leak and would like to focus on that for now.  I advised it would be reasonable to start Flonase, she has a follow-up with me in 6 months, she will have audiological evaluation at that time if she has persistent effusion we will perform nasal endoscopy and evaluate this further.

## 2024-08-14 NOTE — Telephone Encounter (Signed)
 Patient LVM regarding hearing test yesterday. She is requesting a call back regarding the hearing test and some fluid in her ears.

## 2024-08-14 NOTE — Telephone Encounter (Signed)
 Patient called in stating that after her Audio yesterday Dr Tiney stated that she had fluid in her ears and needed to see you.  Patient would like a call back today to determine if you need to see her again or if medication is needed.  Please advise.  [phone]

## 2024-08-17 DIAGNOSIS — D352 Benign neoplasm of pituitary gland: Secondary | ICD-10-CM | POA: Diagnosis not present

## 2024-08-17 DIAGNOSIS — M810 Age-related osteoporosis without current pathological fracture: Secondary | ICD-10-CM | POA: Diagnosis not present

## 2024-08-17 DIAGNOSIS — I129 Hypertensive chronic kidney disease with stage 1 through stage 4 chronic kidney disease, or unspecified chronic kidney disease: Secondary | ICD-10-CM | POA: Diagnosis not present

## 2024-08-17 DIAGNOSIS — E039 Hypothyroidism, unspecified: Secondary | ICD-10-CM | POA: Diagnosis not present

## 2024-08-24 DIAGNOSIS — Z1331 Encounter for screening for depression: Secondary | ICD-10-CM | POA: Diagnosis not present

## 2024-08-24 DIAGNOSIS — I10 Essential (primary) hypertension: Secondary | ICD-10-CM | POA: Diagnosis not present

## 2024-08-24 DIAGNOSIS — R82998 Other abnormal findings in urine: Secondary | ICD-10-CM | POA: Diagnosis not present

## 2024-08-24 DIAGNOSIS — Z23 Encounter for immunization: Secondary | ICD-10-CM | POA: Diagnosis not present

## 2024-08-24 DIAGNOSIS — Z1339 Encounter for screening examination for other mental health and behavioral disorders: Secondary | ICD-10-CM | POA: Diagnosis not present

## 2024-08-24 DIAGNOSIS — I129 Hypertensive chronic kidney disease with stage 1 through stage 4 chronic kidney disease, or unspecified chronic kidney disease: Secondary | ICD-10-CM | POA: Diagnosis not present

## 2024-08-24 DIAGNOSIS — Z0000 Encounter for general adult medical examination without abnormal findings: Secondary | ICD-10-CM | POA: Diagnosis not present

## 2024-09-01 DIAGNOSIS — H16223 Keratoconjunctivitis sicca, not specified as Sjogren's, bilateral: Secondary | ICD-10-CM | POA: Diagnosis not present

## 2024-09-07 DIAGNOSIS — K080 Exfoliation of teeth due to systemic causes: Secondary | ICD-10-CM | POA: Diagnosis not present

## 2024-09-17 DIAGNOSIS — L820 Inflamed seborrheic keratosis: Secondary | ICD-10-CM | POA: Diagnosis not present

## 2024-09-28 DIAGNOSIS — I10 Essential (primary) hypertension: Secondary | ICD-10-CM | POA: Diagnosis not present

## 2024-10-26 DIAGNOSIS — R519 Headache, unspecified: Secondary | ICD-10-CM | POA: Diagnosis not present

## 2024-10-26 DIAGNOSIS — G96811 Intracranial hypotension, spontaneous: Secondary | ICD-10-CM | POA: Diagnosis not present

## 2025-01-04 ENCOUNTER — Ambulatory Visit (INDEPENDENT_AMBULATORY_CARE_PROVIDER_SITE_OTHER): Admitting: Audiology

## 2025-01-04 ENCOUNTER — Ambulatory Visit (INDEPENDENT_AMBULATORY_CARE_PROVIDER_SITE_OTHER): Admitting: Physician Assistant
